# Patient Record
Sex: Male | Born: 1960 | Race: White | Hispanic: No
Health system: Midwestern US, Community
[De-identification: ages and names within clinical notes are randomized; demographics above are authoritative.]

## PROBLEM LIST (undated history)

## (undated) DIAGNOSIS — I739 Peripheral vascular disease, unspecified: Secondary | ICD-10-CM

## (undated) DIAGNOSIS — G4730 Sleep apnea, unspecified: Secondary | ICD-10-CM

## (undated) DIAGNOSIS — E785 Hyperlipidemia, unspecified: Secondary | ICD-10-CM

## (undated) DIAGNOSIS — I10 Essential (primary) hypertension: Secondary | ICD-10-CM

## (undated) DIAGNOSIS — R634 Abnormal weight loss: Secondary | ICD-10-CM

## (undated) DIAGNOSIS — N189 Chronic kidney disease, unspecified: Secondary | ICD-10-CM

## (undated) DIAGNOSIS — Z87442 Personal history of urinary calculi: Secondary | ICD-10-CM

## (undated) DIAGNOSIS — H47011 Ischemic optic neuropathy, right eye: Secondary | ICD-10-CM

## (undated) DIAGNOSIS — R0602 Shortness of breath: Secondary | ICD-10-CM

## (undated) DIAGNOSIS — J45909 Unspecified asthma, uncomplicated: Secondary | ICD-10-CM

## (undated) DIAGNOSIS — R42 Dizziness and giddiness: Secondary | ICD-10-CM

## (undated) DIAGNOSIS — N400 Enlarged prostate without lower urinary tract symptoms: Secondary | ICD-10-CM

## (undated) DIAGNOSIS — H9319 Tinnitus, unspecified ear: Secondary | ICD-10-CM

## (undated) DIAGNOSIS — J189 Pneumonia, unspecified organism: Secondary | ICD-10-CM

## (undated) DIAGNOSIS — D400 Neoplasm of uncertain behavior of prostate: Secondary | ICD-10-CM

## (undated) DIAGNOSIS — N179 Acute kidney failure, unspecified: Secondary | ICD-10-CM

## (undated) DIAGNOSIS — N200 Calculus of kidney: Secondary | ICD-10-CM

## (undated) DIAGNOSIS — N401 Enlarged prostate with lower urinary tract symptoms: Secondary | ICD-10-CM

## (undated) DIAGNOSIS — N50812 Left testicular pain: Secondary | ICD-10-CM

## (undated) DIAGNOSIS — R351 Nocturia: Secondary | ICD-10-CM

## (undated) HISTORY — DX: Hyperlipidemia, unspecified: E78.5

## (undated) HISTORY — PX: CARDIAC CATHETERIZATION: SHX172

## (undated) HISTORY — DX: Shortness of breath: R06.02

## (undated) HISTORY — DX: Peripheral vascular disease, unspecified: I73.9

## (undated) HISTORY — DX: Essential (primary) hypertension: I10

## (undated) HISTORY — PX: DIAPHRAGM PLICATION: SHX1457

## (undated) HISTORY — DX: Ischemic optic neuropathy, right eye: H47.011

## (undated) HISTORY — DX: Abnormal weight loss: R63.4

## (undated) HISTORY — PX: SKIN GRAFT: SHX250

## (undated) HISTORY — DX: Chronic kidney disease, unspecified: N18.9

## (undated) HISTORY — DX: Tinnitus, unspecified ear: H93.19

## (undated) HISTORY — DX: Dizziness and giddiness: R42

## (undated) HISTORY — PX: SMALL INTESTINE SURGERY: SHX150

## (undated) HISTORY — PX: LITHOTRIPSY: SUR834

## (undated) HISTORY — DX: Unspecified asthma, uncomplicated: J45.909

## (undated) HISTORY — PX: SEPTOPLASTY: SUR1290

## (undated) HISTORY — DX: Sleep apnea, unspecified: G47.30

---

## 1978-09-29 HISTORY — PX: APPENDECTOMY: SHX54

## 2012-09-29 HISTORY — PX: ROTATOR CUFF REPAIR: SHX139

## 2015-08-31 DIAGNOSIS — R079 Chest pain, unspecified: Secondary | ICD-10-CM | POA: Insufficient documentation

## 2015-08-31 DIAGNOSIS — R0602 Shortness of breath: Secondary | ICD-10-CM | POA: Insufficient documentation

## 2015-08-31 DIAGNOSIS — I10 Essential (primary) hypertension: Secondary | ICD-10-CM | POA: Insufficient documentation

## 2015-08-31 DIAGNOSIS — K589 Irritable bowel syndrome without diarrhea: Secondary | ICD-10-CM | POA: Insufficient documentation

## 2015-08-31 DIAGNOSIS — J3489 Other specified disorders of nose and nasal sinuses: Secondary | ICD-10-CM | POA: Insufficient documentation

## 2016-04-21 DIAGNOSIS — D400 Neoplasm of uncertain behavior of prostate: Secondary | ICD-10-CM | POA: Insufficient documentation

## 2016-06-12 ENCOUNTER — Telehealth

## 2016-06-12 NOTE — Telephone Encounter (Signed)
Patient was called and told to have uric acid lab work done at [facility]. Patient stated that he will have it done 06/16/16. Lab order was faxed to [phone].

## 2016-06-12 NOTE — Progress Notes (Signed)
Order serum uric acid level

## 2016-06-30 ENCOUNTER — Ambulatory Visit: Admit: 2016-06-30 | Discharge: 2016-06-30 | Payer: PRIVATE HEALTH INSURANCE | Attending: Urology

## 2016-06-30 ENCOUNTER — Encounter: Attending: Urology

## 2016-06-30 DIAGNOSIS — Z87442 Personal history of urinary calculi: Secondary | ICD-10-CM

## 2016-06-30 LAB — AMB POC URINALYSIS DIP STICK AUTO W/ MICRO
Bilirubin (UA POC): NEGATIVE
Blood (UA POC): NEGATIVE
Glucose (UA POC): NEGATIVE
Ketones (UA POC): NEGATIVE
Leukocyte esterase (UA POC): NEGATIVE
Nitrites (UA POC): NEGATIVE
Protein (UA POC): NEGATIVE mg/dL
Specific gravity (UA POC): 1.025 (ref 1.001–1.035)
Urobilinogen (UA POC): 0.2 (ref 0.2–1)
pH (UA POC): 5.5 (ref 4.6–8.0)

## 2016-06-30 LAB — AMB POC URINALYSIS DIP STICK AUTO W/ MICRO (MICRO RESULTS)
Bilirubin (UA POC): NEGATIVE
Blood (UA POC): NEGATIVE
Crystals (UA POC): NEGATIVE
Epithelial cells (UA POC): 0
Glucose (UA POC): NEGATIVE
Ketones (UA POC): NEGATIVE
Leukocyte esterase (UA POC): NEGATIVE
Nitrites (UA POC): NEGATIVE
Protein (UA POC): NEGATIVE mg/dL
RBCs (UA POC): 0
Specific gravity (UA POC): 1.025 (ref 1.001–1.035)
Urobilinogen (UA POC): 0.2 (ref 0.2–1)
WBCs (UA POC): 0
pH (UA POC): 5.5 (ref 4.6–8.0)

## 2016-06-30 MED ORDER — POTASSIUM CITRATE SR 10 MEQ (1,080 MG) TAB
10 mEq (1,080 mg) | ORAL_TABLET | Freq: Three times a day (TID) | ORAL | 12 refills | Status: DC
Start: 2016-06-30 — End: 2016-09-09

## 2016-06-30 NOTE — Progress Notes (Signed)
HISTORY OF PRESENT ILLNESS:  Stanley Young is a 55 y.o. male who presents today f/u 24 hr urine. Pt w/ elevated PTH level, seeing Dr.Gomez for hyperparathyroidism. Serum uric acid normal. 24 hr urine shows needs to double fluid intake and low urine pH. Hx of BPH and post-op retention, on Flomax. Found to have firm prostate on exam.     Pt reports doing well. Not satisfied w/ his care from Dr. Altamease Young, so will refer elsewhere.      AUA Symptom Score 06/30/2016   Over the past month how often have you had the sensation that your bladder was not completely empty after you finished urinating? 0   Over the past month, how often have had to urinate again less than 2 hours after you last finished urinating? 0   Over the past month, how often have you found you stopped and started again several times when you urinated? 0   Over the past month, how often have you found it difficult to postpone urination? 0   Over the past month, how often have you had a weak urinary stream? 0   Over the past month, how often have you had to push or strain to begin urinating? 0   Over the past month, how many times did you most typically get up to urinate from the time you went to bed at night until the time you got up in the morning? 0   AUA Score 0   If you were to spend the rest of your life with your urinary condition the way it is now, how would you feel about that? Pleased       Past Medical History:   Diagnosis Date   ??? Asthma    ??? BPH (benign prostatic hypertrophy)    ??? Hypertension    ??? Kidney stone    ??? Prostate neoplasm        Past Surgical History:   Procedure Laterality Date   ??? FRAGMENT KIDNEY STONE/ ESWL     ??? LASER VAPORIZATION SURGERY PROSTATE, COMPLETE         Social History   Substance Use Topics   ??? Smoking status: Never Smoker   ??? Smokeless tobacco: Never Used   ??? Alcohol use No       Allergies   Allergen Reactions   ??? Codeine Unable to Obtain   ??? Hydrocodone Not Reported This Time       Family History    Problem Relation Age of Onset   ??? Alzheimer Mother    ??? Cancer Father    ??? Heart Attack Father        Current Outpatient Prescriptions   Medication Sig Dispense Refill   ??? potassium citrate (UROCIT-K10) 10 mEq (1,080 mg) TbER Take 1 Tab by mouth three (3) times daily (with meals). 90 Tab 12   ??? tamsulosin (FLOMAX) 0.4 mg capsule Take 0.4 mg by mouth daily.     ??? lisinopril (PRINIVIL, ZESTRIL) 20 mg tablet Take  by mouth daily.         REVIEW OF SYSTEMS:  Constitutional: Fever: No  Skin: Rash: No  HEENT: Hearing difficulty: No  Eyes: Blurred vision: No  Cardiovascular: Chest pain: No  Respiratory: Shortness of breath: No  Gastrointestinal: Nausea/vomiting: No  Musculoskeletal: Back pain: No  Neurological: Weakness: No  Psychological: Memory loss: No  Comments/additional findings:       PHYSICAL EXAMINATION:   Visit Vitals   ??? BP 117/87   ???  Pulse (!) 114   ??? Temp 98 ??F (36.7 ??C)   ??? Resp 18   ??? Ht 6' (1.829 m)   ??? Wt 237 lb (107.5 kg)   ??? BMI 32.14 kg/m2     Constitutional: Well developed, no acute distress.   Eyes:  Conjunctiva normal.  Ears:  External ear normal.  Nose/Throat:  External nose normal.  CV:  Heart rate regular, No peripheral swelling noted.  Respiratory: No respiratory distress, No audible wheeze.  Skin: No rash, No ulcer.    Neuro/Psych:  Patient with appropriate affect.  Alert and oriented x 3.    Gait:  Normal.  GU Male:    [id number] normal to visual inspection. Sphincter with good tone, Rectum with no masses. Prostate is medium.  PENIS: Urethral meatus normal in location and size. No urethral discharge.      REVIEW OF LABS AND IMAGING:      Results for orders placed or performed in visit on 06/30/16   AMB POC URINALYSIS DIP STICK AUTO W/ MICRO   Result Value Ref Range    Color (UA POC) Yellow     Clarity (UA POC) Clear     Glucose (UA POC) Negative Negative    Bilirubin (UA POC) Negative Negative    Ketones (UA POC) Negative Negative    Specific gravity (UA POC) 1.025 1.001 - 1.035     Blood (UA POC) Negative Negative    pH (UA POC) 5.5 4.6 - 8.0    Protein (UA POC) Negative Negative mg/dL    Urobilinogen (UA POC) 0.2 mg/dL 0.2 - 1    Nitrites (UA POC) Negative Negative    Leukocyte esterase (UA POC) Negative Negative   AMB POC URINALYSIS DIP STICK AUTO W/ MICRO (MICRO RESULTS)   Result Value Ref Range    Color (UA POC) Yellow     Clarity (UA POC) Clear     Glucose (UA POC) Negative Negative    Bilirubin (UA POC) Negative Negative    Ketones (UA POC) Negative Negative    Specific gravity (UA POC) 1.025 1.001 - 1.035    Blood (UA POC) Negative Negative    pH (UA POC) 5.5 4.6 - 8.0    Protein (UA POC) Negative Negative mg/dL    Urobilinogen (UA POC) 0.2 mg/dL 0.2 - 1    Nitrites (UA POC) Negative Negative    Leukocyte esterase (UA POC) Negative Negative    Epithelial cells (UA POC) 0     WBCs (UA POC) 0      RBCs (UA POC) 0      Bacteria (UA POC) None Negative    Crystals (UA POC) Negative Negative    Other (UA POC)         ASSESSMENT:     ICD-10-CM ICD-9-CM    1. Personal history of urinary calculi Z87.442 V13.01 AMB POC URINALYSIS DIP STICK AUTO W/ MICRO      AMB POC URINALYSIS DIP STICK AUTO W/ MICRO (MICRO RESULTS)   2. Benign prostatic hyperplasia with lower urinary tract symptoms, symptom details unspecified N40.1 600.01 AMB POC URINALYSIS DIP STICK AUTO W/ MICRO      AMB POC URINALYSIS DIP STICK AUTO W/ MICRO (MICRO RESULTS)      PSA, DIAGNOSTIC (PROSTATE SPECIFIC AG)         PLAN:    ?? PSA today  ?? Refer to another ENT per patient request- has rising PTH level and prescribed Vitamin D by Dr. Altamease Young, for eval  for possible hyperparathyroidism and need for parathyroidectomy  ?? Rx potassium citrate TID  ?? Repeat 24 hr urine in 2 months w/ f/u after- NV pH and chem 7 at 4 and 6 wks  ?? DRE and PSA in 1 yr     Patient's BMI is out of the normal parameters.  Information about BMI was given to the patient.    Chief Complaint   Patient presents with   ??? Kidney Stone     2 mo f/u 24 urine      Medical documentation provided with the assistance of Lonell Grandchild, medical scribe for [employer], DO, [place].    Jaydi Bray [place], DO

## 2016-07-01 LAB — PSA, DIAGNOSTIC (PROSTATE SPECIFIC AG): Prostate Specific Ag: 0.5 ng/ml

## 2016-07-28 ENCOUNTER — Encounter

## 2016-07-30 ENCOUNTER — Institutional Professional Consult (permissible substitution): Admit: 2016-07-30 | Discharge: 2016-07-30 | Payer: PRIVATE HEALTH INSURANCE

## 2016-07-30 DIAGNOSIS — Z87442 Personal history of urinary calculi: Secondary | ICD-10-CM

## 2016-07-30 LAB — AMB POC URINALYSIS DIP STICK AUTO W/O MICRO
Blood (UA POC): NEGATIVE
Glucose (UA POC): NEGATIVE
Leukocyte esterase (UA POC): NEGATIVE
Nitrites (UA POC): NEGATIVE
Protein (UA POC): NEGATIVE mg/dL
Specific gravity (UA POC): 1.025 (ref 1.001–1.035)
Urobilinogen (UA POC): 0.2 (ref 0.2–1)
pH (UA POC): 5.5 (ref 4.6–8.0)

## 2016-07-30 NOTE — Progress Notes (Signed)
Stanley Young is here today per the request of Dr. Reinaldo Raddle.       He is here to give a urine specimen.  Urine is obtained from patient via clean catch.     Patient complains of nothing   Patient denies dysuria     Urine was not sent for culture.   This is not a repeat urine culture.   UA performed: yes    Results for orders placed or performed in visit on 07/30/16   AMB POC URINALYSIS DIP STICK AUTO W/O MICRO   Result Value Ref Range    Color (UA POC) Amber     Clarity (UA POC) Clear     Glucose (UA POC) Negative Negative    Bilirubin (UA POC) 1+ Negative    Ketones (UA POC) Trace Negative    Specific gravity (UA POC) 1.025 1.001 - 1.035    Blood (UA POC) Negative Negative    pH (UA POC) 5.5 4.6 - 8.0    Protein (UA POC) Negative Negative mg/dL    Urobilinogen (UA POC) 0.2 mg/dL 0.2 - 1    Nitrites (UA POC) Negative Negative    Leukocyte esterase (UA POC) Negative Negative        Urine was not sent to [facility] for processing of urine culture.   Patient is informed that it will be at least 48 hours before results are available     Orders Placed This Encounter   ??? AMB POC URINALYSIS DIP STICK AUTO W/O MICRO     Reviewed by [facility], DO.

## 2016-07-31 NOTE — Progress Notes (Signed)
Order renal ultrasound- acute renal failure- tell patient Cr a little high so want ultrasound.

## 2016-08-01 ENCOUNTER — Encounter

## 2016-08-01 ENCOUNTER — Telehealth

## 2016-08-01 NOTE — Telephone Encounter (Signed)
-----   Message from [facility], [place] sent at 07/31/2016  1:00 PM EDT -----  Order renal ultrasound- acute renal failure- tell patient Cr a little high so want ultrasound.

## 2016-08-01 NOTE — Telephone Encounter (Signed)
Patient notified of lab results and renal ultrasound 08/08/16 at 2:00PM. Ultrasound order has been faxed to radiology department at [place]

## 2016-08-13 ENCOUNTER — Encounter

## 2016-08-14 ENCOUNTER — Ambulatory Visit: Admit: 2016-08-14 | Discharge: 2016-08-14 | Payer: PRIVATE HEALTH INSURANCE | Attending: Urology

## 2016-08-14 DIAGNOSIS — R1032 Left lower quadrant pain: Secondary | ICD-10-CM

## 2016-08-14 LAB — AMB POC URINALYSIS DIP STICK AUTO W/ MICRO (MICRO RESULTS)
Bilirubin (UA POC): NEGATIVE
Blood (UA POC): NEGATIVE
Crystals (UA POC): NEGATIVE
Epithelial cells (UA POC): 0
Glucose (UA POC): NEGATIVE
Ketones (UA POC): NEGATIVE
Leukocyte esterase (UA POC): NEGATIVE
Nitrites (UA POC): NEGATIVE
Protein (UA POC): NEGATIVE
RBCs (UA POC): 0
Specific gravity (UA POC): 1.025 (ref 1.001–1.035)
Urobilinogen (UA POC): 0.2 (ref 0.2–1)
WBCs (UA POC): 0
pH (UA POC): 5.5 (ref 4.6–8.0)

## 2016-08-14 LAB — AMB POC PVR, MEAS,POST-VOID RES,US,NON-IMAGING: PVR: 0 cc

## 2016-08-14 NOTE — Progress Notes (Signed)
HISTORY OF PRESENT ILLNESS:  Stanley Young is a 55 y.o. male who presents today for f/u recent flank and groin pain. Pt w/ hx of stones, BPH, firm prostate, and elevated PTH. Pt on Flomax and K citrate. K and Cr wnl. PH today normal. Renal ultrasound done due to mild renal failure neg. Scrotal ultrasound done for left groin pain showed a mild right varicocele.     Pt notes that he has severe, sharp left groin pain that has been present for approx 2 months. Has gradually gotten worse and is becoming more constant.     AUA Symptom Score 08/14/2016   Over the past month how often have you had the sensation that your bladder was not completely empty after you finished urinating? 3   Over the past month, how often have had to urinate again less than 2 hours after you last finished urinating? 4   Over the past month, how often have you found you stopped and started again several times when you urinated? 1   Over the past month, how often have you found it difficult to postpone urination? 2   Over the past month, how often have you had a weak urinary stream? 1   Over the past month, how often have you had to push or strain to begin urinating? 2   Over the past month, how many times did you most typically get up to urinate from the time you went to bed at night until the time you got up in the morning? 4   AUA Score 17   If you were to spend the rest of your life with your urinary condition the way it is now, how would you feel about that? Mixed-about equally satisfied       Past Medical History:   Diagnosis Date   ??? Asthma    ??? BPH (benign prostatic hypertrophy)    ??? Hypertension    ??? Kidney stone    ??? Prostate neoplasm        Past Surgical History:   Procedure Laterality Date   ??? FRAGMENT KIDNEY STONE/ ESWL     ??? LASER VAPORIZATION SURGERY PROSTATE, COMPLETE         Social History   Substance Use Topics   ??? Smoking status: Never Smoker   ??? Smokeless tobacco: Never Used   ??? Alcohol use No       Allergies    Allergen Reactions   ??? Codeine Unable to Obtain   ??? Hydrocodone Not Reported This Time       Family History   Problem Relation Age of Onset   ??? Alzheimer Mother    ??? Cancer Father    ??? Heart Attack Father        Current Outpatient Prescriptions   Medication Sig Dispense Refill   ??? potassium citrate (UROCIT-K10) 10 mEq (1,080 mg) TbER Take 1 Tab by mouth three (3) times daily (with meals). 90 Tab 12   ??? tamsulosin (FLOMAX) 0.4 mg capsule Take 0.4 mg by mouth daily.     ??? lisinopril (PRINIVIL, ZESTRIL) 20 mg tablet Take  by mouth daily.         REVIEW OF SYSTEMS:  Constitutional: Fever: No  Skin: Rash: No  HEENT: Hearing difficulty: No  Eyes: Blurred vision: No  Cardiovascular: Chest pain: No  Respiratory: Shortness of breath: No  Gastrointestinal: Nausea/vomiting: No  Musculoskeletal: Back pain: No  Neurological: Weakness: No  Psychological: Memory loss: No  Comments/additional  findings:       PHYSICAL EXAMINATION:   Visit Vitals   ??? BP (!) 133/96   ??? Pulse 98   ??? Temp 98.8 ??F (37.1 ??C)   ??? Resp 18   ??? Ht 6' (1.829 m)   ??? Wt 236 lb (107 kg)   ??? BMI 32.01 kg/m2     Constitutional: Well developed, no acute distress.   Eyes:  Conjunctiva normal.  Ears:  External ear normal.  Nose/Throat:  External nose normal.  CV:  Heart rate regular, No peripheral swelling noted.  Respiratory: No respiratory distress, No audible wheeze.  Skin: No rash, No ulcer.    Neuro/Psych:  Patient with appropriate affect.  Alert and oriented x 3.    Gait:  Normal.      REVIEW OF LABS AND IMAGING:      Results for orders placed or performed in visit on 08/14/16   AMB POC URINALYSIS DIP STICK AUTO W/ MICRO (MICRO RESULTS)   Result Value Ref Range    Color (UA POC) Yellow     Clarity (UA POC) Clear     Glucose (UA POC) Negative Negative    Bilirubin (UA POC) Negative Negative    Ketones (UA POC) Negative Negative    Specific gravity (UA POC) 1.025 1.001 - 1.035    Blood (UA POC) Negative Negative    pH (UA POC) 5.5 4.6 - 8.0     Protein (UA POC) Negative Negative    Urobilinogen (UA POC) 0.2 mg/dL 0.2 - 1    Nitrites (UA POC) Negative Negative    Leukocyte esterase (UA POC) Negative Negative    Epithelial cells (UA POC) 0     WBCs (UA POC) 0      RBCs (UA POC) 0      Bacteria (UA POC) None Negative    Crystals (UA POC) Negative Negative    Other (UA POC)     AMB POC PVR, MEAS,POST-VOID RES,US,NON-IMAGING   Result Value Ref Range    PVR 0 cc       ASSESSMENT:     ICD-10-CM ICD-9-CM    1. Left inguinal pain R10.32 789.09 CT ABD PELV W WO CONT   2. Benign prostatic hyperplasia with lower urinary tract symptoms, symptom details unspecified N40.1 600.01 AMB POC URINALYSIS DIP STICK AUTO W/ MICRO (MICRO RESULTS)      AMB POC PVR, MEAS,POST-VOID RES,US,NON-IMAGING   3. Personal history of urinary calculi Z87.442 V13.01    4. Acute renal failure, unspecified acute renal failure type ([place]) N17.9 584.9    5. Right varicocele I86.1 456.4    6. Pelvic pain R10.2 [id number]          PLAN:    ?? Schedule for CT pelvis w contrast for left pelvic/inguinal pain  ?? Repeat 24 hr urine in December  ?? F/u as scheduled  ?? Cont K citrate and Flomax  ?? Chem 7 and pH in 6 months  ?? If Cr still elevated w/ refer to nephrology  ?? Full exam and PSA due in October 2018  ?? Cont f/u w/ ENT and endocrinology     Patient's BMI is out of the normal parameters.  Information about BMI was given to the patient.    Chief Complaint   Patient presents with   ??? Pelvic Pain     Medical documentation provided with the assistance of Lonell Grandchild, medical scribe for [employer], [place], [place].    Zimri Brennen [place], DO

## 2016-08-18 DIAGNOSIS — N400 Enlarged prostate without lower urinary tract symptoms: Secondary | ICD-10-CM | POA: Insufficient documentation

## 2016-08-18 DIAGNOSIS — N219 Calculus of lower urinary tract, unspecified: Secondary | ICD-10-CM | POA: Insufficient documentation

## 2016-08-20 NOTE — Telephone Encounter (Signed)
CT scan is Dec. 01, 2017 at 8:30AM. Patient was called and verbalized understanding of CT appointment.

## 2016-08-25 DIAGNOSIS — N183 Chronic kidney disease, stage 3 unspecified: Secondary | ICD-10-CM | POA: Insufficient documentation

## 2016-09-01 NOTE — Progress Notes (Signed)
Review ct

## 2016-09-08 NOTE — Telephone Encounter (Signed)
Patient was notified that Dr. Reinaldo Raddle does need to see patient tomorrow. Patient voiced understanding.

## 2016-09-08 NOTE — Telephone Encounter (Signed)
Patient would like to know if he has to keep appointment tomorrow. He is now seeing a nephrologist and they went over his 24hr results with him. He also states that he spoke with [place] last week and Darrick Meigs was supposed to notify him if his recent imaging was negative or not. Patient states he can't afford to pay $80 co-pay and miss time from work if everything is negative.

## 2016-09-09 ENCOUNTER — Ambulatory Visit: Admit: 2016-09-09 | Discharge: 2016-09-09 | Payer: PRIVATE HEALTH INSURANCE | Attending: Urology

## 2016-09-09 DIAGNOSIS — K4090 Unilateral inguinal hernia, without obstruction or gangrene, not specified as recurrent: Secondary | ICD-10-CM

## 2016-09-09 LAB — AMB POC URINALYSIS DIP STICK AUTO W/ MICRO (MICRO RESULTS)
Bilirubin (UA POC): NEGATIVE
Blood (UA POC): NEGATIVE
Crystals (UA POC): NEGATIVE
Epithelial cells (UA POC): 0
Glucose (UA POC): NEGATIVE
Ketones (UA POC): NEGATIVE
Leukocyte esterase (UA POC): NEGATIVE
Nitrites (UA POC): NEGATIVE
Protein (UA POC): NEGATIVE
RBCs (UA POC): 0
Specific gravity (UA POC): 1.02 (ref 1.001–1.035)
Urobilinogen (UA POC): 1 (ref 0.2–1)
WBCs (UA POC): 0
pH (UA POC): 6 (ref 4.6–8.0)

## 2016-09-09 MED ORDER — POTASSIUM CITRATE ER 15 MEQ (1,620 MG) TABLET,EXTENDED RELEASE
15 mEq | ORAL_TABLET | Freq: Three times a day (TID) | ORAL | 12 refills | Status: DC
Start: 2016-09-09 — End: 2017-10-26

## 2016-09-09 NOTE — Progress Notes (Signed)
HISTORY OF PRESENT ILLNESS:  Stanley Young is a 55 y.o. male who presents today for f/u hx of BPH - on Flomax, firm prostate, hx of stones - on K citrate. Has elevated PTH level, seeing ENT. C/o left severe pelvic/inguinal pain - found to have very small right varicocele. CT shows right proximal ureteral thickening and a left fat containing inguinal hernia. 24 hr urine shows patient still needs to increase fluids, Caox level high, and low urine pH.    AUA Symptom Score 09/09/2016   Over the past month how often have you had the sensation that your bladder was not completely empty after you finished urinating? 1   Over the past month, how often have had to urinate again less than 2 hours after you last finished urinating? 3   Over the past month, how often have you found you stopped and started again several times when you urinated? 2   Over the past month, how often have you found it difficult to postpone urination? 2   Over the past month, how often have you had a weak urinary stream? 1   Over the past month, how often have you had to push or strain to begin urinating? 1   Over the past month, how many times did you most typically get up to urinate from the time you went to bed at night until the time you got up in the morning? 3   AUA Score 13   If you were to spend the rest of your life with your urinary condition the way it is now, how would you feel about that? Mixed-about equally satisfied       Past Medical History:   Diagnosis Date   ??? Asthma    ??? BPH (benign prostatic hypertrophy)    ??? Hypertension    ??? Kidney stone    ??? Prostate neoplasm        Past Surgical History:   Procedure Laterality Date   ??? FRAGMENT KIDNEY STONE/ ESWL     ??? LASER VAPORIZATION SURGERY PROSTATE, COMPLETE         Social History   Substance Use Topics   ??? Smoking status: Never Smoker   ??? Smokeless tobacco: Never Used   ??? Alcohol use No       Allergies   Allergen Reactions   ??? Codeine Unable to Obtain    ??? Hydrocodone Not Reported This Time       Family History   Problem Relation Age of Onset   ??? Alzheimer Mother    ??? Cancer Father    ??? Heart Attack Father        Current Outpatient Prescriptions   Medication Sig Dispense Refill   ??? Potassium Citrate (UROCIT-K) TbER tablet Take 1 Tab by mouth three (3) times daily (with meals). 90 Tab 12   ??? tamsulosin (FLOMAX) 0.4 mg capsule Take 0.4 mg by mouth daily.     ??? lisinopril (PRINIVIL, ZESTRIL) 20 mg tablet Take  by mouth daily.         REVIEW OF SYSTEMS:  Constitutional: Fever: No  Skin: Rash: No  HEENT: Hearing difficulty: No  Eyes: Blurred vision: No  Cardiovascular: Chest pain: No  Respiratory: Shortness of breath: No  Gastrointestinal: Nausea/vomiting: No  Musculoskeletal: Back pain: No  Neurological: Weakness: No  Psychological: Memory loss: No  Comments/additional findings:       PHYSICAL EXAMINATION:   Visit Vitals   ??? BP (!) 149/91   ???  Pulse 86   ??? Temp 98.9 ??F (37.2 ??C)   ??? Ht 6' (1.829 m)   ??? Wt 236 lb (107 kg)   ??? BMI 32.01 kg/m2     Constitutional: Well developed, no acute distress.   Eyes:  Conjunctiva normal.  Ears:  External ear normal.  Nose/Throat:  External nose normal.  CV:  Heart rate regular, No peripheral swelling noted.  Respiratory: No respiratory distress, No audible wheeze.  Skin: No rash, No ulcer.    Neuro/Psych:  Patient with appropriate affect.  Alert and oriented x 3.    Gait:  Normal.      REVIEW OF LABS AND IMAGING:      Results for orders placed or performed in visit on 09/09/16   AMB POC URINALYSIS DIP STICK AUTO W/ MICRO (MICRO RESULTS)   Result Value Ref Range    Color (UA POC) Yellow     Clarity (UA POC) Clear     Glucose (UA POC) Negative Negative    Bilirubin (UA POC) Negative Negative    Ketones (UA POC) Negative Negative    Specific gravity (UA POC) 1.020 1.001 - 1.035    Blood (UA POC) Negative Negative    pH (UA POC) 6.0 4.6 - 8.0    Protein (UA POC) Negative Negative    Urobilinogen (UA POC) 1 mg/dL 0.2 - 1     Nitrites (UA POC) Negative Negative    Leukocyte esterase (UA POC) Negative Negative    Epithelial cells (UA POC) 0     WBCs (UA POC) 0      RBCs (UA POC) 0      Bacteria (UA POC) None Negative    Crystals (UA POC) Negative Negative    Other (UA POC)         ASSESSMENT:     ICD-10-CM ICD-9-CM    1. Left inguinal hernia K40.90 550.90    2. Benign prostatic hyperplasia with lower urinary tract symptoms, symptom details unspecified N40.1 600.01    3. Personal history of urinary calculi Z87.442 V13.01    4. 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 Increase K citrate to 65meq TID, told to increase fluids, follow with endocrine/ENT  ?? Refer to general surgery for left inguinal hernia  ?? Plan right ureteroscopy w/ possible biopsy- hopefully at time of inguinal hernia repair   ?? Plan full exam and PSA in October 2018     Patient's BMI is out of the normal parameters.  Information about BMI was given and patient was advised to follow-up with their PCP for further management.    Chief Complaint   Patient presents with   ??? Kidney Stone     f/u     Medical documentation provided with the assistance of Lonell Grandchild, medical scribe for [employer], DO, [place].    Cole Klugh [place], DO

## 2016-09-10 ENCOUNTER — Encounter: Attending: Urology

## 2016-09-29 HISTORY — PX: HERNIA REPAIR: SHX51

## 2016-10-03 ENCOUNTER — Ambulatory Visit: Admit: 2016-10-03 | Discharge: 2016-10-03 | Payer: PRIVATE HEALTH INSURANCE | Attending: Urology

## 2016-10-03 DIAGNOSIS — N401 Enlarged prostate with lower urinary tract symptoms: Secondary | ICD-10-CM

## 2016-10-03 LAB — AMB POC URINALYSIS DIP STICK AUTO W/ MICRO (MICRO RESULTS)
Blood (UA POC): NEGATIVE
Crystals (UA POC): NEGATIVE
Epithelial cells (UA POC): 0
Glucose (UA POC): NEGATIVE
Ketones (UA POC): NEGATIVE
Leukocyte esterase (UA POC): NEGATIVE
Nitrites (UA POC): NEGATIVE
Protein (UA POC): NEGATIVE
RBCs (UA POC): 0
Specific gravity (UA POC): 1.03 (ref 1.001–1.035)
Urobilinogen (UA POC): 0.2 (ref 0.2–1)
WBCs (UA POC): 0
pH (UA POC): 5.5 (ref 4.6–8.0)

## 2016-10-03 NOTE — Progress Notes (Signed)
HISTORY OF PRESENT ILLNESS:  Stanley Young is a 56 y.o. male who presents today for f/u BPH, on Flomax. Hx of firm prostate, stones - on K citrate, and high PTH - sees ENT. Was found to have right proximal ureteral thickening and a left inguinal hernia. 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     Pt reports doing well. Still having soreness from inguinal hernia repair. Pt denies any stone pain. Denies any dysuria or hematuria.     AUA Symptom Score 10/03/2016   Over the past month how often have you had the sensation that your bladder was not completely empty after you finished urinating? 3   Over the past month, how often have had to urinate again less than 2 hours after you last finished urinating? 3   Over the past month, how often have you found you stopped and started again several times when you urinated? 2   Over the past month, how often have you found it difficult to postpone urination? 3   Over the past month, how often have you had a weak urinary stream? 2   Over the past month, how often have you had to push or strain to begin urinating? 1   Over the past month, how many times did you most typically get up to urinate from the time you went to bed at night until the time you got up in the morning? 3   AUA Score 17   If you were to spend the rest of your life with your urinary condition the way it is now, how would you feel about that? Mixed-about equally satisfied       Past Medical History:   Diagnosis Date   ??? Asthma    ??? BPH (benign prostatic hypertrophy)    ??? Hypertension    ??? Kidney stone    ??? Prostate neoplasm        Past Surgical History:   Procedure Laterality Date   ??? FRAGMENT KIDNEY STONE/ ESWL     ??? LASER VAPORIZATION SURGERY PROSTATE, COMPLETE         Social History   Substance Use Topics   ??? Smoking status: Never Smoker   ??? Smokeless tobacco: Never Used   ??? Alcohol use No       Allergies   Allergen Reactions   ??? Codeine Unable to Obtain    ??? Hydrocodone Not Reported This Time       Family History   Problem Relation Age of Onset   ??? Alzheimer Mother    ??? Cancer Father    ??? Heart Attack Father        Current Outpatient Prescriptions   Medication Sig Dispense Refill   ??? Potassium Citrate (UROCIT-K) TbER tablet Take 1 Tab by mouth three (3) times daily (with meals). 90 Tab 12   ??? tamsulosin (FLOMAX) 0.4 mg capsule Take 0.4 mg by mouth daily.     ??? lisinopril (PRINIVIL, ZESTRIL) 20 mg tablet Take  by mouth daily.         REVIEW OF SYSTEMS:  Constitutional: Fever: No  Skin: Rash: No  HEENT: Hearing difficulty: No  Eyes: Blurred vision: No  Cardiovascular: Chest pain: No  Respiratory: Shortness of breath: No  Gastrointestinal: Nausea/vomiting: No  Musculoskeletal: Back pain: No  Neurological: Weakness: No  Psychological: Memory loss: No  Comments/additional findings:       PHYSICAL EXAMINATION:   Visit Vitals   ???  BP 118/87   ??? Pulse 92   ??? Temp 97.6 ??F (36.4 ??C)   ??? Resp 16   ??? Ht 6' (1.829 m)   ??? Wt 234 lb (106.1 kg)   ??? BMI 31.74 kg/m2     Constitutional: Well developed, no acute distress.   Eyes:  Conjunctiva normal.  Ears:  External ear normal.  Nose/Throat:  External nose normal.  CV:  Heart rate regular, No peripheral swelling noted.  Respiratory: No respiratory distress, No audible wheeze.  Skin: No rash, No ulcer.    Neuro/Psych:  Patient with appropriate affect.  Alert and oriented x 3.    Gait:  Normal.      REVIEW OF LABS AND IMAGING:      Results for orders placed or performed in visit on 10/03/16   AMB POC URINALYSIS DIP STICK AUTO W/ MICRO (MICRO RESULTS)   Result Value Ref Range    Color (UA POC) Yellow     Clarity (UA POC) Clear     Glucose (UA POC) Negative Negative    Bilirubin (UA POC) 1+ Negative    Ketones (UA POC) Negative Negative    Specific gravity (UA POC) 1.030 1.001 - 1.035    Blood (UA POC) Negative Negative    pH (UA POC) 5.5 4.6 - 8.0    Protein (UA POC) Negative Negative    Urobilinogen (UA POC) 0.2 mg/dL 0.2 - 1     Nitrites (UA POC) Negative Negative    Leukocyte esterase (UA POC) Negative Negative    Epithelial cells (UA POC) 0     WBCs (UA POC) 0      RBCs (UA POC) 0      Bacteria (UA POC) None Negative    Crystals (UA POC) Negative Negative    Other (UA POC)         ASSESSMENT:     ICD-10-CM ICD-9-CM    1. Benign prostatic hyperplasia with lower urinary tract symptoms, symptom details unspecified N40.1 600.01    2. 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 Personal history of urinary calculi Z87.442 V13.01    4. Left inguinal hernia K40.90 550.90         PLAN:    ?? Cont K citrate and Flomax  ?? F/u end of October w/ PSA - reminder set  ?? KUB 1 yr - reminder set     Patient's BMI is out of the normal parameters.  Information about BMI was given and patient was advised to follow-up with their PCP for further management.    Chief Complaint   Patient presents with   ??? Post OP Follow Up     f/u ureteroscopy     Medical documentation provided with the assistance of Lonell Grandchild, medical scribe for [employer], DO, [place].    Tulsi Crossett [place], DO

## 2016-10-27 DIAGNOSIS — Z8639 Personal history of other endocrine, nutritional and metabolic disease: Secondary | ICD-10-CM | POA: Insufficient documentation

## 2017-08-03 ENCOUNTER — Encounter: Attending: Urology

## 2017-10-01 ENCOUNTER — Telehealth

## 2017-10-01 NOTE — Telephone Encounter (Signed)
Spoke to pt and advised him that it was time for his follow up. Appt made for pt for 10/26/17 at 2:300pm. Pt also informed that he will need tests completed before that appt. Pt verbalized understanding. Orders at the front desk for pick up.

## 2017-10-13 LAB — PSA, DIAGNOSTIC (PROSTATE SPECIFIC AG): Prostate Specific Ag: 0.4 ng/mL (ref 0.0–3.0)

## 2017-10-14 ENCOUNTER — Encounter

## 2017-10-26 ENCOUNTER — Ambulatory Visit: Admit: 2017-10-26 | Discharge: 2017-10-26 | Attending: Urology

## 2017-10-26 DIAGNOSIS — D400 Neoplasm of uncertain behavior of prostate: Secondary | ICD-10-CM

## 2017-10-26 LAB — AMB POC URINALYSIS DIP STICK AUTO W/ MICRO (MICRO RESULTS)
Bilirubin (UA POC): NEGATIVE
Blood (UA POC): NEGATIVE
Crystals (UA POC): NEGATIVE
Epithelial cells (UA POC): 0
Glucose (UA POC): NEGATIVE
Leukocyte esterase (UA POC): NEGATIVE
Nitrites (UA POC): NEGATIVE
Protein (UA POC): NEGATIVE
RBCs (UA POC): 0
Specific gravity (UA POC): 1.025 (ref 1.001–1.035)
Urobilinogen (UA POC): 1 (ref 0.2–1)
WBCs (UA POC): 0
pH (UA POC): 6.5 (ref 4.6–8.0)

## 2017-10-26 MED ORDER — POTASSIUM CITRATE SR 10 MEQ (1,080 MG) TAB
10 mEq (1,080 mg) | ORAL_TABLET | Freq: Two times a day (BID) | ORAL | 12 refills | Status: AC
Start: 2017-10-26 — End: ?

## 2017-10-26 MED ORDER — TAMSULOSIN SR 0.4 MG 24 HR CAP
0.4 mg | ORAL_CAPSULE | Freq: Every evening | ORAL | 12 refills | Status: DC
Start: 2017-10-26 — End: 2018-03-17

## 2017-10-26 NOTE — Progress Notes (Signed)
HISTORY OF PRESENT ILLNESS:  Stanley Young is a 57 y.o. male who presents today for 1 yr f/u BPH and hx of stones, on Flomax and K citrate. KUB neg. PSA 0.4. Hx of firm prostate and high PTH - sees ENT. 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     Pt reports doing well. Nocturia x1-2. Does have some episodes of urgency and post-void dribbling. Pt has stopped Flomax and K citrate.     AUA Symptom Score 10/26/2017   Over the past month how often have you had the sensation that your bladder was not completely empty after you finished urinating? 3   Over the past month, how often have had to urinate again less than 2 hours after you last finished urinating? 2   Over the past month, how often have you found you stopped and started again several times when you urinated? 2   Over the past month, how often have you found it difficult to postpone urination? 4   Over the past month, how often have you had a weak urinary stream? 4   Over the past month, how often have you had to push or strain to begin urinating? 2   Over the past month, how many times did you most typically get up to urinate from the time you went to bed at night until the time you got up in the morning? 2   AUA Score 19   If you were to spend the rest of your life with your urinary condition the way it is now, how would you feel about that? Mixed-about equally satisfied       Past Medical History:   Diagnosis Date   ??? Asthma    ??? BPH (benign prostatic hypertrophy)    ??? Hypertension    ??? Kidney stone    ??? Prostate neoplasm        Past Surgical History:   Procedure Laterality Date   ??? FRAGMENT KIDNEY STONE/ ESWL     ??? LASER VAPORIZATION SURGERY PROSTATE, COMPLETE         Social History     Tobacco Use   ??? Smoking status: Never Smoker   ??? Smokeless tobacco: Never Used   Substance Use Topics   ??? Alcohol use: No   ??? Drug use: No       Allergies    Allergen Reactions   ??? Codeine Unable to Obtain   ??? Hydrocodone Not Reported This Time   ??? Oxycodone Itching       Family History   Problem Relation Age of Onset   ??? Alzheimer Mother    ??? Cancer Father    ??? Heart Attack Father        Current Outpatient Medications   Medication Sig Dispense Refill   ??? lisinopril (PRINIVIL, ZESTRIL) 40 mg tablet      ??? potassium citrate (UROCIT-K10) 10 mEq (1,080 mg) TbER Take 1 Tab by mouth two (2) times a day. 60 Tab 12   ??? tamsulosin (FLOMAX) 0.4 mg capsule Take 1 Cap by mouth nightly. 30 Cap 12       REVIEW OF SYSTEMS:  Constitutional: Fever: No  Skin: Rash: No  HEENT: Hearing difficulty: No  Eyes: Blurred vision: No  Cardiovascular: Chest pain: No  Respiratory: Shortness of breath: No  Gastrointestinal: Nausea/vomiting: No  Musculoskeletal: Back pain: No  Neurological: Weakness: No  Psychological: Memory loss: No  Comments/additional findings:       PHYSICAL EXAMINATION:   Visit Vitals  BP (!) 193/112   Pulse 95   Temp 96.9 ??F (36.1 ??C)   Resp 18   Ht 6' (1.829 m)   Wt 239 lb 6.4 oz (108.6 kg)   BMI 32.47 kg/m??     Constitutional: Well developed, no acute distress.   Eyes:  Conjunctiva normal.  Ears:  External ear normal.  Nose/Throat:  External nose normal.  CV:  Heart rate regular. No peripheral swelling noted.  Respiratory: No respiratory distress. No audible wheeze.  Abdomen:  Soft, nontender, nondistended, no mass/organomegaly.  Vascular:  Abdominal Aorta nonpalpable.  Skin: No rash. No ulcer.    Neuro/Psych:  Patient with appropriate affect. Alert and oriented x 3.    Gait:  Normal.  GU Male:     CVA: non-tender bilaterally. Bladder not palpable.  DRE: Perineum normal to visual inspection. Sphincter with good tone. Rectum with no masses. Prostate is moderately enlarged and generally firm.  SCROTUM:  No scrotal rash or lesions. Normal right testis and epididymidis - left testicle absent. No inguinal hernias noted.   PENIS: Urethral meatus normal in location and size. No urethral discharge.      REVIEW OF LABS AND IMAGING:      Results for orders placed or performed in visit on 10/26/17   AMB POC URINALYSIS DIP STICK AUTO W/ MICRO (MICRO RESULTS)   Result Value Ref Range    Color (UA POC) Yellow     Clarity (UA POC) Clear     Glucose (UA POC) Negative Negative    Bilirubin (UA POC) Negative Negative    Ketones (UA POC) Trace Negative    Specific gravity (UA POC) 1.025 1.001 - 1.035    Blood (UA POC) Negative Negative    pH (UA POC) 6.5 4.6 - 8.0    Protein (UA POC) Negative Negative    Urobilinogen (UA POC) 1 mg/dL 0.2 - 1    Nitrites (UA POC) Negative Negative    Leukocyte esterase (UA POC) Negative Negative    Epithelial cells (UA POC) 0     WBCs (UA POC) 0      RBCs (UA POC) 0      Bacteria (UA POC) None Negative    Crystals (UA POC) Negative Negative    Other (UA POC)         ASSESSMENT:     ICD-10-CM ICD-9-CM    1. 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 Personal history of urinary calculi Z87.442 V13.01    3. Benign prostatic hyperplasia with nocturia N40.1 600.01     R35.1 788.43        PLAN:    ?? Rx K citrate 10 mEq BID  ?? Rx Flomax 0.4mg   ?? Nurse visit in 3 and 6 wks for pH/Chem 7  ?? F/u 2 months to reassess    Patient's BMI is out of the normal parameters. Information about BMI was given and patient was advised to follow-up with their PCP for further management.    Chief Complaint   Patient presents with   ??? Benign Prostatic Hypertrophy     Medical documentation provided with the assistance of Lonell Grandchild, medical scribe for [employer], [place], [place].    Sephora Boyar [place], DO

## 2017-11-16 ENCOUNTER — Institutional Professional Consult (permissible substitution): Admit: 2017-11-16 | Discharge: 2017-11-16

## 2017-11-16 DIAGNOSIS — N200 Calculus of kidney: Secondary | ICD-10-CM

## 2017-11-16 LAB — AMB POC URINALYSIS DIP STICK AUTO W/ MICRO (MICRO RESULTS)
Blood (UA POC): NEGATIVE
Glucose (UA POC): NEGATIVE
Leukocyte esterase (UA POC): NEGATIVE
Nitrites (UA POC): NEGATIVE
Protein (UA POC): NEGATIVE
Specific gravity (UA POC): 1.025 (ref 1.001–1.035)
Urobilinogen (UA POC): 0.2 (ref 0.2–1)
pH (UA POC): 5.5 (ref 4.6–8.0)

## 2017-11-16 NOTE — Progress Notes (Signed)
Stanley Young is here today per Dr. Candi Leash give a urine specimen.       He is here for urine ph      Urine is obtained from patient via clean catch.     Patient is symptomatic: no  Patient complains of: na   Patient denies na    Urine was not sent for culture.   This is not a repeat urine culture.   UA performed: yes    Results for orders placed or performed in visit on 10/26/17   AMB POC URINALYSIS DIP STICK AUTO W/ MICRO (MICRO RESULTS)   Result Value Ref Range    Color (UA POC) Yellow     Clarity (UA POC) Clear     Glucose (UA POC) Negative Negative    Bilirubin (UA POC) Negative Negative    Ketones (UA POC) Trace Negative    Specific gravity (UA POC) 1.025 1.001 - 1.035    Blood (UA POC) Negative Negative    pH (UA POC) 6.5 4.6 - 8.0    Protein (UA POC) Negative Negative    Urobilinogen (UA POC) 1 mg/dL 0.2 - 1    Nitrites (UA POC) Negative Negative    Leukocyte esterase (UA POC) Negative Negative    Epithelial cells (UA POC) 0     WBCs (UA POC) 0      RBCs (UA POC) 0      Bacteria (UA POC) None Negative    Crystals (UA POC) Negative Negative    Other (UA POC)            Orders Placed This Encounter   ??? BASIC METABOLIC PANEL     Standing Status:   Future     Number of Occurrences:   1     Standing Expiration Date:   05/16/2018   ??? AMB POC URINALYSIS DIP STICK AUTO W/ MICRO (MICRO RESULTS)   ??? COLLECTION VENOUS BLOOD,VENIPUNCTURE       [name], LPN   Reviewed by [employer], DO.

## 2017-11-16 NOTE — Progress Notes (Signed)
Stanley Young presents today for lab draw per Dr. Reinaldo Raddle order.   Dr. Reinaldo Raddle was present in the clinic as incident to.     BMP obtained via venipuncture without any difficulty.    Patient will be notified with lab results.     No orders of the defined types were placed in this encounter.      [employer], LPN

## 2017-11-17 LAB — METABOLIC PANEL, BASIC
BUN/Creatinine ratio: 10 (ref 9–20)
BUN: 12 mg/dL (ref 6–24)
CO2: 21 mmol/L (ref 20–29)
Calcium: 9.6 mg/dL (ref 8.7–10.2)
Chloride: 105 mmol/L (ref 96–106)
Creatinine: 1.19 mg/dL (ref 0.76–1.27)
GFR est AA: 77 mL/min/{1.73_m2} (ref >59)
GFR est non-AA: 67 mL/min/{1.73_m2} (ref >59)
Glucose: 80 mg/dL (ref 65–99)
Potassium: 4.4 mmol/L (ref 3.5–5.2)
Sodium: 144 mmol/L (ref 134–144)

## 2017-12-07 ENCOUNTER — Encounter

## 2017-12-14 ENCOUNTER — Encounter

## 2017-12-23 ENCOUNTER — Encounter: Attending: Urology

## 2017-12-31 ENCOUNTER — Encounter: Attending: Urology

## 2018-02-03 DIAGNOSIS — R9439 Abnormal result of other cardiovascular function study: Secondary | ICD-10-CM | POA: Insufficient documentation

## 2018-02-04 ENCOUNTER — Encounter: Attending: Urology

## 2018-02-26 LAB — PSA, DIAGNOSTIC (PROSTATE SPECIFIC AG): Prostate Specific Ag: 0.4 ng/mL

## 2018-02-26 LAB — PSA PROSTATIC SPECIFIC ANTIGEN: PSA: 0.4 ng/mL

## 2018-03-17 ENCOUNTER — Ambulatory Visit: Attending: Urology

## 2018-03-17 ENCOUNTER — Ambulatory Visit: Admit: 2018-03-17 | Discharge: 2018-03-17 | Attending: Urology

## 2018-03-17 DIAGNOSIS — N401 Enlarged prostate with lower urinary tract symptoms: Secondary | ICD-10-CM

## 2018-03-17 MED ORDER — TAMSULOSIN SR 0.4 MG 24 HR CAP
0.4 mg | ORAL_CAPSULE | Freq: Every evening | ORAL | 12 refills | Status: DC
Start: 2018-03-17 — End: 2018-04-16

## 2018-03-17 NOTE — Progress Notes (Signed)
Progress Notes by Lavone Neri, DO at 03/17/18 1015                Author: Lavone Neri, DO  Service: --  Author Type: Physician       Filed: 03/18/18 0931  Encounter Date: 03/17/2018  Status: Signed          Editor: Lavone Neri, DO (Physician)                          HISTORY OF PRESENT ILLNESS:  Stanley Young  is a 57 y.o. male who presents  today for 2 mo f/u BPH and hx of stones. Was on Flomax and K citrate, but had stopped so restarted last visit. PSA 0.4. Cr 1.3. Potassium 4.8. Hx of firm prostate and high PTH - sees ENT. 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       Pt reports doing well. Nocturia still x1-2. Still having some episodes of urgency and post-void dribbling.          AUA Symptom Score  03/17/2018        Over the past month how often have you had the sensation that your bladder was not completely empty after you finished urinating?  3     Over the past month, how often have had to urinate again less than 2 hours after you last finished urinating?  3     Over the past month, how often have you found you stopped and started again several times when you urinated?  5     Over the past month, how often have you found it difficult to postpone urination?  4     Over the past month, how often have you had a weak urinary stream?  3     Over the past month, how often have you had to push or strain to begin urinating?  3     Over the past month, how many times did you most typically get up to urinate from the time you went to bed at night until the time you got up in  the morning?  3     AUA Score  24        If you were to spend the rest of your life with your urinary condition the way it is now, how would you feel about that?  Mostly dissatisfied             Past Medical History:        Diagnosis  Date         ?  Asthma       ?  BPH (benign prostatic hypertrophy)       ?  Heart abnormality        ?  Hypertension       ?  Kidney stone           ?  Prostate neoplasm               Past Surgical History:         Procedure  Laterality  Date          ?  FRAGMENT KIDNEY STONE/ ESWL              ?  LASER VAPORIZATION SURGERY PROSTATE, COMPLETE  Social History          Tobacco Use         ?  Smoking status:  Never Smoker     ?  Smokeless tobacco:  Never Used       Substance Use Topics         ?  Alcohol use:  No         ?  Drug use:  No             Allergies        Allergen  Reactions         ?  Codeine  Unable to Obtain     ?  Hydrocodone  Not Reported This Time         ?  Oxycodone  Itching             Family History         Problem  Relation  Age of Onset          ?  Alzheimer  Mother       ?  Cancer  Father            ?  Heart Attack  Father               Current Outpatient Medications          Medication  Sig  Dispense  Refill           ?  atorvastatin (LIPITOR) 80 mg tablet  Take 80 mg by mouth.         ?  aspirin 81 mg chewable tablet  Take 81 mg by mouth.         ?  nitroglycerin (NITROSTAT) 0.4 mg SL tablet  0.4 mg by SubLINGual route.         ?  tamsulosin (FLOMAX) 0.4 mg capsule  Take 2 Caps by mouth nightly.  60 Cap  12     ?  amLODIPine (NORVASC) 5 mg tablet      2     ?  lisinopril (PRINIVIL, ZESTRIL) 40 mg tablet                 ?  potassium citrate (UROCIT-K10) 10 mEq (1,080 mg) TbER  Take 1 Tab by mouth two (2) times a day.  60 Tab  12           REVIEW OF SYSTEMS:   Constitutional: Fever: No   Skin: Rash: No   HEENT: Hearing difficulty: No   Eyes: Blurred vision: No   Cardiovascular: Chest pain: Yes   Respiratory: Shortness of breath: No   Gastrointestinal: Nausea/vomiting: No   Musculoskeletal: Back pain: No   Neurological: Weakness: No   Psychological: Memory loss: No   Comments/additional findings:          PHYSICAL EXAMINATION:    Visit Vitals      BP  145/53     Pulse  84     Temp  97.2 ??F (36.2 ??C)     Resp  18     Ht  6' (1.829 m)     Wt  231 lb (104.8 kg)        BMI   31.33 kg/m??        Constitutional: Well developed, no acute distress.    Eyes:  Conjunctiva normal.   Ears:  External ear normal.   Nose/Throat:  External nose normal.   CV:  Heart rate  regular.  No peripheral swelling noted.   Respiratory: No respiratory distress. No audible wheeze.   Skin: No rash. No ulcer.      Neuro/Psych:  Patient with appropriate affect. Alert and oriented x 3.      Gait:  Normal.         REVIEW OF LABS AND IMAGING:           PSA /TESTOSTERONE - BSHSI  PSA     Latest Ref Rng & Units  ng/mL        02/26/2018  0.4     11/16/2017       10/13/2017  0.4     06/30/2016  0.5           ASSESSMENT:              ICD-10-CM  ICD-9-CM             1.  Benign prostatic hyperplasia with nocturia  N40.1  600.01              R35.1  788.43             2.  Neoplasm of uncertain behavior of prostate  D40.0  236.5             3.  Personal history of urinary calculi  Z87.442  V13.01             PLAN:     ??  Increase Flomax to 0.8mg    ??  Cont K citrate 10 mEq BID   ??  F/u 1 month to reassess   ??  Full exam in January   ??  Chem 7 and PSA in May       Patient's BMI is out of the normal parameters. Information about BMI was given and patient was advised to follow-up with their PCP for further management.        Chief Complaint       Patient presents with        ?  Benign Prostatic Hypertrophy        ?  Kidney [employer] documentation provided with the assistance of Lonell Grandchild, medical  scribe for [employer], DO, [place].      Stanley Bais [place], DO

## 2018-03-17 NOTE — Progress Notes (Signed)
HISTORY OF PRESENT ILLNESS:  Stanley Young is a 57 y.o. male who presents today for 2 mo f/u BPH and hx of stones. Was on Flomax and K citrate, but had stopped so restarted last visit. PSA 0.4. Cr 1.3. Potassium 4.8. Hx of firm prostate and high PTH - sees ENT. 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     Pt reports doing well. Nocturia still x1-2. Still having some episodes of urgency and post-void dribbling.     AUA Symptom Score 03/17/2018   Over the past month how often have you had the sensation that your bladder was not completely empty after you finished urinating? 3   Over the past month, how often have had to urinate again less than 2 hours after you last finished urinating? 3   Over the past month, how often have you found you stopped and started again several times when you urinated? 5   Over the past month, how often have you found it difficult to postpone urination? 4   Over the past month, how often have you had a weak urinary stream? 3   Over the past month, how often have you had to push or strain to begin urinating? 3   Over the past month, how many times did you most typically get up to urinate from the time you went to bed at night until the time you got up in the morning? 3   AUA Score 24   If you were to spend the rest of your life with your urinary condition the way it is now, how would you feel about that? Mostly dissatisfied       Past Medical History:   Diagnosis Date   ??? Asthma    ??? BPH (benign prostatic hypertrophy)    ??? Heart abnormality    ??? Hypertension    ??? Kidney stone    ??? Prostate neoplasm        Past Surgical History:   Procedure Laterality Date   ??? FRAGMENT KIDNEY STONE/ ESWL     ??? LASER VAPORIZATION SURGERY PROSTATE, COMPLETE         Social History     Tobacco Use   ??? Smoking status: Never Smoker   ??? Smokeless tobacco: Never Used   Substance Use Topics   ??? Alcohol use: No    ??? Drug use: No       Allergies   Allergen Reactions   ??? Codeine Unable to Obtain   ??? Hydrocodone Not Reported This Time   ??? Oxycodone Itching       Family History   Problem Relation Age of Onset   ??? Alzheimer Mother    ??? Cancer Father    ??? Heart Attack Father        Current Outpatient Medications   Medication Sig Dispense Refill   ??? atorvastatin (LIPITOR) 80 mg tablet Take 80 mg by mouth.     ??? aspirin 81 mg chewable tablet Take 81 mg by mouth.     ??? nitroglycerin (NITROSTAT) 0.4 mg SL tablet 0.4 mg by SubLINGual route.     ??? tamsulosin (FLOMAX) 0.4 mg capsule Take 2 Caps by mouth nightly. 60 Cap 12   ??? amLODIPine (NORVASC) 5 mg tablet   2   ??? lisinopril (PRINIVIL, ZESTRIL) 40 mg tablet      ??? potassium citrate (UROCIT-K10) 10 mEq (1,080 mg)  TbER Take 1 Tab by mouth two (2) times a day. 60 Tab 12       REVIEW OF SYSTEMS:  Constitutional: Fever: No  Skin: Rash: No  HEENT: Hearing difficulty: No  Eyes: Blurred vision: No  Cardiovascular: Chest pain: Yes  Respiratory: Shortness of breath: No  Gastrointestinal: Nausea/vomiting: No  Musculoskeletal: Back pain: No  Neurological: Weakness: No  Psychological: Memory loss: No  Comments/additional findings:       PHYSICAL EXAMINATION:   Visit Vitals  BP 145/53   Pulse 84   Temp 97.2 ??F (36.2 ??C)   Resp 18   Ht 6' (1.829 m)   Wt 231 lb (104.8 kg)   BMI 31.33 kg/m??     Constitutional: Well developed, no acute distress.   Eyes:  Conjunctiva normal.  Ears:  External ear normal.  Nose/Throat:  External nose normal.  CV:  Heart rate regular. No peripheral swelling noted.  Respiratory: No respiratory distress. No audible wheeze.  Skin: No rash. No ulcer.    Neuro/Psych:  Patient with appropriate affect. Alert and oriented x 3.    Gait:  Normal.      REVIEW OF LABS AND IMAGING:      PSA /TESTOSTERONE - BSHSI PSA   Latest Ref Rng & Units ng/mL   02/26/2018 0.4   11/16/2017    10/13/2017 0.4   06/30/2016 0.5       ASSESSMENT:     ICD-10-CM ICD-9-CM     1. Benign prostatic hyperplasia with nocturia N40.1 600.01     R35.1 788.43    2. 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 Personal history of urinary calculi Z87.442 V13.01        PLAN:    ?? Increase Flomax to 0.8mg   ?? Cont K citrate 10 mEq BID  ?? F/u 1 month to reassess  ?? Full exam in January  ?? Chem 7 and PSA in May     Patient's BMI is out of the normal parameters. Information about BMI was given and patient was advised to follow-up with their PCP for further management.    Chief Complaint   Patient presents with   ??? Benign Prostatic Hypertrophy   ??? Kidney [employer] documentation provided with the assistance of Lonell Grandchild, medical scribe for [employer], DO, [place].    Jaber Dunlow [place], DO

## 2018-03-24 NOTE — Telephone Encounter (Signed)
Pt called and said he had inncreased Flomax to 0.8mg  per Dr. Glenford Peers recommendation. Pt stated that ever since he increased the dosage, he's been getting dizzy spells. I advised him to go back to 0.4mg  and I would forward the information to Dr. Reinaldo Raddle.

## 2018-04-06 DIAGNOSIS — I2510 Atherosclerotic heart disease of native coronary artery without angina pectoris: Secondary | ICD-10-CM | POA: Insufficient documentation

## 2018-04-06 DIAGNOSIS — E785 Hyperlipidemia, unspecified: Secondary | ICD-10-CM | POA: Insufficient documentation

## 2018-04-16 ENCOUNTER — Ambulatory Visit: Attending: Urology

## 2018-04-16 ENCOUNTER — Ambulatory Visit: Admit: 2018-04-16 | Discharge: 2018-04-16 | Attending: Urology

## 2018-04-16 DIAGNOSIS — N401 Enlarged prostate with lower urinary tract symptoms: Secondary | ICD-10-CM

## 2018-04-16 MED ORDER — SILODOSIN 8 MG CAPSULE
8 mg | ORAL_CAPSULE | Freq: Every day | ORAL | 12 refills | Status: AC
Start: 2018-04-16 — End: ?

## 2018-04-16 NOTE — Progress Notes (Signed)
Progress Notes by Lavone Neri, DO at 04/16/18 1015                Author: Lavone Neri, DO  Service: --  Author Type: Physician       Filed: 04/19/18 1538  Encounter Date: 04/16/2018  Status: Signed          Editor: Lavone Neri, DO (Physician)                          HISTORY OF PRESENT ILLNESS:  Stanley Young  is a 57 y.o. male who presents  today for 1 mo f/u BPH, Flomax increased to 0.8mg  last visit. Hx of stones, on K citrate. Hx of firm prostate and high PTH - sees ENT. 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       Pt reports that Flomax caused severe dizziness.          AUA Symptom Score  04/16/2018        Over the past month how often have you had the sensation that your bladder was not completely empty after you finished urinating?  3     Over the past month, how often have had to urinate again less than 2 hours after you last finished urinating?  4     Over the past month, how often have you found you stopped and started again several times when you urinated?  3     Over the past month, how often have you found it difficult to postpone urination?  4     Over the past month, how often have you had a weak urinary stream?  4     Over the past month, how often have you had to push or strain to begin urinating?  3     Over the past month, how many times did you most typically get up to urinate from the time you went to bed at night until the time you got up in  the morning?  3     AUA Score  24        If you were to spend the rest of your life with your urinary condition the way it is now, how would you feel about that?  Mostly dissatisfied             Past Medical History:        Diagnosis  Date         ?  Asthma       ?  BPH (benign prostatic hypertrophy)       ?  Heart abnormality       ?  Hypertension       ?  Kidney stone           ?  Prostate neoplasm               Past Surgical History:          Procedure  Laterality  Date          ?  FRAGMENT KIDNEY STONE/ ESWL              ?  LASER VAPORIZATION SURGERY PROSTATE, COMPLETE                 Social History          Tobacco Use         ?  Smoking status:  Never Smoker     ?  Smokeless tobacco:  Never Used       Substance Use Topics         ?  Alcohol use:  No         ?  Drug use:  No             Allergies        Allergen  Reactions         ?  Codeine  Unable to Obtain     ?  Hydrocodone  Not Reported This Time         ?  Oxycodone  Itching             Family History         Problem  Relation  Age of Onset          ?  Alzheimer  Mother       ?  Cancer  Father            ?  Heart Attack  Father               Current Outpatient Medications          Medication  Sig  Dispense  Refill           ?  silodosin (RAPAFLO) 8 mg capsule  Take 1 Cap by mouth daily (with breakfast).  30 Cap  12     ?  atorvastatin (LIPITOR) 80 mg tablet  Take 80 mg by mouth.         ?  aspirin 81 mg chewable tablet  Take 81 mg by mouth.         ?  amLODIPine (NORVASC) 5 mg tablet      2     ?  nitroglycerin (NITROSTAT) 0.4 mg SL tablet  0.4 mg by SubLINGual route.         ?  lisinopril (PRINIVIL, ZESTRIL) 40 mg tablet                 ?  potassium citrate (UROCIT-K10) 10 mEq (1,080 mg) TbER  Take 1 Tab by mouth two (2) times a day.  60 Tab  12           REVIEW OF SYSTEMS:   Constitutional: Fever: No   Skin: Rash: No   HEENT: Hearing difficulty: No   Eyes: Blurred vision: No   Cardiovascular: Chest pain: No   Respiratory: Shortness of breath: No   Gastrointestinal: Nausea/vomiting: No   Musculoskeletal: Back pain: No   Neurological: Weakness: No   Psychological: Memory loss: No   Comments/additional findings:          PHYSICAL EXAMINATION:    Visit Vitals      BP  126/83     Pulse  83     Temp  97.9 ??F (36.6 ??C)     Resp  18     Ht  6' (1.829 m)     Wt  226 lb (102.5 kg)        BMI  30.65 kg/m??        Constitutional: Well developed, no acute distress.    Eyes: Conjunctiva normal.    Ears: External ear normal.   Nose/Throat: External nose normal.   CV: Heart rate regular.  No peripheral swelling noted.   Respiratory: No respiratory distress. No audible wheeze.   Skin: No rash. No ulcer.  Neuro/Psych: Patient with appropriate affect. Alert and oriented x 3.      Gait: Normal.         REVIEW OF LABS AND IMAGING:           PSA /TESTOSTERONE - [facility]  PSA     Latest Ref Rng & Units  ng/mL        02/26/2018  0.4     11/16/2017       10/13/2017  0.4     06/30/2016  0.5           ASSESSMENT:              ICD-10-CM  ICD-9-CM             1.  Benign prostatic hyperplasia with nocturia  N40.1  600.01              R35.1  788.43             2.  Personal history of urinary calculi  Z87.442  V13.01       3.  Neoplasm of uncertain behavior of prostate  D40.0  236.5             4.  Ureteropelvic junction (UPJ) obstruction  N13.5  593.4             PLAN:     ??  Rx Rapaflo 8mg    ??  Cont K citrate 10 mEq BID   ??  If able to get Rapaflo, f/u in 1 month   ??  If not, will get in for UroLift screening   ??  Full exam in January   ??  Chem 7 and PSA in May       Patient's BMI is out of the normal parameters. Information about BMI was given and patient was advised to follow-up with their PCP for further management.        Chief Complaint       Patient presents with        ?  Benign Prostatic Hypertrophy        Medical documentation provided with the assistance of Lonell Grandchild, medical  scribe for [employer], DO, [place].      Stanley Mckiernan [place], DO

## 2018-04-16 NOTE — Progress Notes (Signed)
HISTORY OF PRESENT ILLNESS:  Stanley Young is a 57 y.o. male who presents today for 1 mo f/u BPH, Flomax increased to 0.8mg  last visit. Hx of stones, on K citrate. Hx of firm prostate and high PTH - sees ENT. 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     Pt reports that Flomax caused severe dizziness.     AUA Symptom Score 04/16/2018   Over the past month how often have you had the sensation that your bladder was not completely empty after you finished urinating? 3   Over the past month, how often have had to urinate again less than 2 hours after you last finished urinating? 4   Over the past month, how often have you found you stopped and started again several times when you urinated? 3   Over the past month, how often have you found it difficult to postpone urination? 4   Over the past month, how often have you had a weak urinary stream? 4   Over the past month, how often have you had to push or strain to begin urinating? 3   Over the past month, how many times did you most typically get up to urinate from the time you went to bed at night until the time you got up in the morning? 3   AUA Score 24   If you were to spend the rest of your life with your urinary condition the way it is now, how would you feel about that? Mostly dissatisfied       Past Medical History:   Diagnosis Date   ??? Asthma    ??? BPH (benign prostatic hypertrophy)    ??? Heart abnormality    ??? Hypertension    ??? Kidney stone    ??? Prostate neoplasm        Past Surgical History:   Procedure Laterality Date   ??? FRAGMENT KIDNEY STONE/ ESWL     ??? LASER VAPORIZATION SURGERY PROSTATE, COMPLETE         Social History     Tobacco Use   ??? Smoking status: Never Smoker   ??? Smokeless tobacco: Never Used   Substance Use Topics   ??? Alcohol use: No   ??? Drug use: No       Allergies   Allergen Reactions   ??? Codeine Unable to Obtain    ??? Hydrocodone Not Reported This Time   ??? Oxycodone Itching       Family History   Problem Relation Age of Onset   ??? Alzheimer Mother    ??? Cancer Father    ??? Heart Attack Father        Current Outpatient Medications   Medication Sig Dispense Refill   ??? silodosin (RAPAFLO) 8 mg capsule Take 1 Cap by mouth daily (with breakfast). 30 Cap 12   ??? atorvastatin (LIPITOR) 80 mg tablet Take 80 mg by mouth.     ??? aspirin 81 mg chewable tablet Take 81 mg by mouth.     ??? amLODIPine (NORVASC) 5 mg tablet   2   ??? nitroglycerin (NITROSTAT) 0.4 mg SL tablet 0.4 mg by SubLINGual route.     ??? lisinopril (PRINIVIL, ZESTRIL) 40 mg tablet      ??? potassium citrate (UROCIT-K10) 10 mEq (1,080 mg) TbER Take 1 Tab by mouth two (2) times a day. 60 Tab 12  REVIEW OF SYSTEMS:  Constitutional: Fever: No  Skin: Rash: No  HEENT: Hearing difficulty: No  Eyes: Blurred vision: No  Cardiovascular: Chest pain: No  Respiratory: Shortness of breath: No  Gastrointestinal: Nausea/vomiting: No  Musculoskeletal: Back pain: No  Neurological: Weakness: No  Psychological: Memory loss: No  Comments/additional findings:       PHYSICAL EXAMINATION:   Visit Vitals  BP 126/83   Pulse 83   Temp 97.9 ??F (36.6 ??C)   Resp 18   Ht 6' (1.829 m)   Wt 226 lb (102.5 kg)   BMI 30.65 kg/m??     Constitutional: Well developed, no acute distress.   Eyes: Conjunctiva normal.  Ears: External ear normal.  Nose/Throat: External nose normal.  CV: Heart rate regular. No peripheral swelling noted.  Respiratory: No respiratory distress. No audible wheeze.  Skin: No rash. No ulcer.    Neuro/Psych: Patient with appropriate affect. Alert and oriented x 3.    Gait: Normal.      REVIEW OF LABS AND IMAGING:      PSA /TESTOSTERONE - [facility] PSA   Latest Ref Rng & Units ng/mL   02/26/2018 0.4   11/16/2017    10/13/2017 0.4   06/30/2016 0.5       ASSESSMENT:     ICD-10-CM ICD-9-CM    1. Benign prostatic hyperplasia with nocturia N40.1 600.01     R35.1 788.43     2. Personal history of urinary calculi Z87.442 V13.01    3. Neoplasm of uncertain behavior of prostate D40.0 236.5    4. Ureteropelvic junction (UPJ) obstruction N13.5 593.4        PLAN:    ?? Rx Rapaflo 8mg   ?? Cont K citrate 10 mEq BID  ?? If able to get Rapaflo, f/u in 1 month  ?? If not, will get in for UroLift screening  ?? Full exam in January  ?? Chem 7 and PSA in May     Patient's BMI is out of the normal parameters. Information about BMI was given and patient was advised to follow-up with their PCP for further management.    Chief Complaint   Patient presents with   ??? Benign Prostatic Hypertrophy     Medical documentation provided with the assistance of Lonell Grandchild, medical scribe for [employer], [place], [place].    Nuh Lipton [place], DO

## 2018-05-19 ENCOUNTER — Encounter: Attending: Urology

## 2018-05-24 NOTE — Progress Notes (Signed)
Pt called would like a phone call to discuss up coming appt,states he wants to decide on having an procedure,because medication isn't working and it didn't make sense to pay a 50.00 copayment just to talk.

## 2018-05-24 NOTE — Telephone Encounter (Signed)
Called and spoke with patient and  He states he wants to go ahead w urolift screen as rapaflo didn't work.  So his appt was changed to procedure appt on 05/26/18

## 2018-05-26 ENCOUNTER — Ambulatory Visit: Attending: Urology

## 2018-05-26 ENCOUNTER — Encounter

## 2018-05-26 ENCOUNTER — Ambulatory Visit: Admit: 2018-05-26 | Discharge: 2018-05-26 | Attending: Urology

## 2018-05-26 DIAGNOSIS — N401 Enlarged prostate with lower urinary tract symptoms: Secondary | ICD-10-CM

## 2018-05-26 LAB — AMB POC URINALYSIS DIP STICK AUTO W/ MICRO (MICRO RESULTS)
Crystals (UA POC): NEGATIVE
Crystals (UA POC): NEGATIVE
Epi Cells Urine, POC: 0 NA
Epithelial cells (UA POC): 0
Glucose (UA POC): NEGATIVE
Glucose, Urine, POC: NEGATIVE
Leukocyte Esterase, Urine, POC: NEGATIVE
Leukocyte esterase (UA POC): NEGATIVE
Nitrite, Urine, POC: NEGATIVE
Nitrites (UA POC): NEGATIVE
Protein (UA POC): NEGATIVE
Protein, Urine, POC: NEGATIVE
RBC, Urine, POC: 0
RBCs (UA POC): 0
Specific Gravity, Urine, POC: 1.02 NA (ref 1.001–1.035)
Specific gravity (UA POC): 1.02 (ref 1.001–1.035)
Urobilinogen (UA POC): 1 (ref 0.2–1)
Urobilinogen, POC: 1 (ref 0.2–1)
WBC, Urine, POC: 0
WBCs (UA POC): 0
pH (UA POC): 6.5 (ref 4.6–8.0)
pH, Urine, POC: 6.5 NA (ref 4.6–8.0)

## 2018-05-26 NOTE — Progress Notes (Signed)
Progress Notes by Lavone Neri, DO at 05/26/18 0845                Author: Lavone Neri, DO  Service: --  Author Type: Physician       Filed: 05/26/18 1121  Encounter Date: 05/26/2018  Status: Signed          Editor: Lavone Neri, DO (Physician)                               HISTORY OF PRESENT ILLNESS:  Stanley Young  is a 57 y.o. male who presents  today for urolift screen for bph with nocturia 1-2, urgency, and post-void dribble.  Flomax caused dizziness and Rapaflo did not help. Firm prostate.  Hx stones on K citrate.   High PTH- seeing ENT.  Has mild right UPJO.  Found on screen today to have  moderate bph and volume 23g.            AUA Symptom Score  05/26/2018        Over the past month how often have you had the sensation that your bladder was not completely empty after you finished urinating?  3     Over the past month, how often have had to urinate again less than 2 hours after you last finished urinating?  4     Over the past month, how often have you found you stopped and started again several times when you urinated?  3     Over the past month, how often have you found it difficult to postpone urination?  4     Over the past month, how often have you had a weak urinary stream?  4     Over the past month, how often have you had to push or strain to begin urinating?  3     Over the past month, how many times did you most typically get up to urinate from the time you went to bed at night until the time you got up in  the morning?  3     AUA Score  24        If you were to spend the rest of your life with your urinary condition the way it is now, how would you feel about that?  Mostly dissatisfied             Past Medical History:        Diagnosis  Date         ?  Asthma       ?  BPH (benign prostatic hypertrophy)       ?  Heart abnormality       ?  Hypertension       ?  Kidney stone       ?  Prostate neoplasm           ?  Vasospasm [facility])               Past Surgical History:          Procedure  Laterality  Date          ?  FRAGMENT KIDNEY STONE/ ESWL         ?  HX HEART CATHETERIZATION              ?  LASER VAPORIZATION SURGERY PROSTATE, COMPLETE  Social History          Tobacco Use         ?  Smoking status:  Never Smoker     ?  Smokeless tobacco:  Never Used       Substance Use Topics         ?  Alcohol use:  No         ?  Drug use:  No             Allergies        Allergen  Reactions         ?  Codeine  Unable to Obtain     ?  Hydrocodone  Not Reported This Time         ?  Oxycodone  Itching             Family History         Problem  Relation  Age of Onset          ?  Alzheimer  Mother       ?  Cancer  Father            ?  Heart Attack  Father               Current Outpatient Medications          Medication  Sig  Dispense  Refill           ?  NIFEdipine ER (PROCARDIA XL) 30 mg ER tablet      0     ?  silodosin (RAPAFLO) 8 mg capsule  Take 1 Cap by mouth daily (with breakfast).  30 Cap  12     ?  atorvastatin (LIPITOR) 80 mg tablet  Take 80 mg by mouth.         ?  aspirin 81 mg chewable tablet  Take 81 mg by mouth.         ?  amLODIPine (NORVASC) 5 mg tablet      2     ?  nitroglycerin (NITROSTAT) 0.4 mg SL tablet  0.4 mg by SubLINGual route.               ?  potassium citrate (UROCIT-K10) 10 mEq (1,080 mg) TbER  Take 1 Tab by mouth two (2) times a day.  60 Tab  12              REVIEW OF SYSTEMS:   Constitutional: Fever: No   Skin: Rash: No   HEENT: Hearing difficulty: No   Eyes: Blurred vision: No   Cardiovascular: Chest pain: Yes   Respiratory: Shortness of breath: No   Gastrointestinal: Nausea/vomiting: No   Musculoskeletal: Back pain: No   Neurological: Weakness: No   Psychological: Memory loss: No   Comments/additional findings:          PHYSICAL EXAMINATION:    Visit Vitals      BP  (!) 140/94     Pulse  86     Temp  97.7 ??F (36.5 ??C)     Resp  18     Ht  6' (1.829 m)     Wt  219 lb 6.4 oz (99.5 kg)        BMI  29.76 kg/m??        Constitutional: Well developed, no acute  distress.    Eyes:  Conjunctiva normal.   Ears:  External ear normal.   Nose/Throat:  External nose normal.   CV:  Heart rate regular,  No peripheral swelling noted.   Respiratory: No respiratory distress, No audible wheeze.   Skin: No rash, No ulcer.      Neuro/Psych:  Patient with appropriate affect.  Alert and oriented x 3.      Gait:  Normal.   GU Male:     SCROTUM: No scrotal rash or lesions. Normal bilateral testes  and epididymides. No inguinal hernias noted.   PENIS: Urethral meatus normal in location and size. No urethral discharge .         REVIEW OF LABS AND IMAGING:          Results for orders placed or performed in visit on 05/26/18     AMB POC URINALYSIS DIP STICK AUTO W/ MICRO (MICRO RESULTS)         Result  Value  Ref Range            Color (UA POC)  Yellow         Clarity (UA POC)  Clear         Glucose (UA POC)  Negative  Negative       Bilirubin (UA POC)  1+  Negative       Ketones (UA POC)  Trace  Negative       Specific gravity (UA POC)  1.020  1.001 - 1.035       Blood (UA POC)  Trace  Negative       pH (UA POC)  6.5  4.6 - 8.0       Protein (UA POC)  Negative  Negative       Urobilinogen (UA POC)  1 mg/dL  0.2 - 1       Nitrites (UA POC)  Negative  Negative       Leukocyte esterase (UA POC)  Negative  Negative       Epithelial cells (UA POC)  0         WBCs (UA POC)  0          RBCs (UA POC)  0          Bacteria (UA POC)  None  Negative       Crystals (UA POC)  Negative  Negative            Other (UA POC)               ASSESSMENT:              ICD-10-CM  ICD-9-CM             1.  Benign prostatic hyperplasia with nocturia  N40.1  600.01  CYSTOURETHROSCOPY            R35.1  788.43  ECHO,TRANSRECTAL           AMB POC URINALYSIS DIP STICK AUTO W/ MICRO (MICRO RESULTS)           METABOLIC PANEL, BASIC           CBC W/O DIFF           CUSTOMER REQUEST URINE CULTURE           CANCELED: CBC W/O DIFF           2.  Preop testing  Z01.818  V72.84  EKG, 12 LEAD, INITIAL     3.  Personal history of urinary  calculi  Z87.442  V13.01  4.  Ureteropelvic junction (UPJ) obstruction  N13.5  593.4              PLAN:     ??  Discussed option of Urolift vs turp- patient wants to have urolift- explained risks/benefits.   ??  KUB Jan 2020.   ??  psa March 2020.         Patient's BMI is out of the normal parameters. Information about BMI was given and patient was advised to follow-up with their PCP for further management.        Chief Complaint       Patient presents with        ?  Benign Prostatic Hypertrophy        Medical documentation provided with the assistance of Lonell Grandchild, medical  scribe for [employer], DO, [place].      Devansh Riese [place], DO

## 2018-05-26 NOTE — Progress Notes (Signed)
Progress  Notes by Isac Sarna at 05/26/18 0845                Author: Isac Sarna  Service: --  Author TypeDagoberto Reef       Filed: 06/14/18 1036  Encounter Date: 05/26/2018  Status: Addendum          Editor: Estelle June, DO (Physician)          Related Notes: Original Note by Isac Sarna filed at 05/26/18 1121                          Urolift Screen      05/26/18      Patient Name: Stanley Young             Universal Procedure Pause:      1. Correct patient confirmed:  yes      2. Allergies confirmed:  yes      3. Patient taking antiplatelet medications:  yes      4. Patient taking anticoagulants:  no      5. Correct procedure and side confirmed and consent signed:  yes      6. Correct antibiotics confirmed:  no           Pre-procedure Diagnosis:              ICD-10-CM  ICD-9-CM             1.  Benign prostatic hyperplasia with nocturia  N40.1  600.01  CYSTOURETHROSCOPY            R35.1  788.43  ECHO,TRANSRECTAL           AMB POC URINALYSIS DIP STICK AUTO W/ MICRO (MICRO RESULTS)           METABOLIC PANEL, BASIC           CBC W/O DIFF           CUSTOMER REQUEST URINE CULTURE           CANCELED: CBC W/O DIFF           2.  Preop testing  Z01.818  V72.84  EKG, 12 LEAD, INITIAL     3.  Personal history of urinary calculi  Z87.442  V13.01             4.  Ureteropelvic junction (UPJ) obstruction  N13.5  593.4           Here for UroLift screen for BPH w/ nocturia x1-2, urgency, and post-void dribbling. Flomax caused dizziness and Rapaflo did not help. Hx of stones, on K citrate. Hx of firm prostate and high PTH - sees ENT. Was found to have right proximal ureteral thickening  and a left inguinal hernia, s/p repair and neg ureteroscopy except for mild UPJO.       Post-procedure Diagnosis: SAME      Consent:   All risks, benefits and options were reviewed in detail and the patient agrees to procedure. Risks include but are not limited to bleeding, infection, sepsis, death, dysuria and others.        Visit Vitals      BP  (!) 140/94     Pulse  86     Temp  97.7 F (36.5 C)     Resp  18     Ht  6' (1.829 m)     Wt  219 lb 6.4 oz (99.5 kg)  BMI  29.76 kg/m           Procedure:   The patient was placed in the supine position, and prepped and draped in the normal fashion. 5 ml of 2% Lidocaine gel was placed in the urethra. Once adequate anesthesia was achieved; the flexible cystoscope  was placed into the urethra.       Findings as follows:        Meatus: normal   Urethra: normal   Prostate: moderate obstruction   Median lobe: no   Bladder neck: normal   Trigone: normal   Trabeculation:normal   Diverticuli: none   Lesion: none      Antibiotic provided:  Yes          Transrectal ultrasound prostate volume determination:   Patient placed in left lateral decubitus position with knees flexed to chest.  Ultrasound wand placed in rectum after lubricated. Prostate volume: 23.39g.  Hypoechoic nodules: no         Lab / Imaging:      Results for orders placed or performed in visit on 05/26/18     AMB POC URINALYSIS DIP STICK AUTO W/ MICRO (MICRO RESULTS)         Result  Value  Ref Range            Color (UA POC)  Yellow         Clarity (UA POC)  Clear         Glucose (UA POC)  Negative  Negative       Bilirubin (UA POC)  1+  Negative       Ketones (UA POC)  Trace  Negative       Specific gravity (UA POC)  1.020  1.001 - 1.035       Blood (UA POC)  Trace  Negative       pH (UA POC)  6.5  4.6 - 8.0       Protein (UA POC)  Negative  Negative       Urobilinogen (UA POC)  1 mg/dL  0.2 - 1       Nitrites (UA POC)  Negative  Negative       Leukocyte esterase (UA POC)  Negative  Negative       Epithelial cells (UA POC)  0         WBCs (UA POC)  0          RBCs (UA POC)  0          Bacteria (UA POC)  None  Negative       Crystals (UA POC)  Negative  Negative            Other (UA POC)                Diagnoses:              ICD-10-CM  ICD-9-CM             1.  Benign prostatic hyperplasia with nocturia  N40.1  600.01   CYSTOURETHROSCOPY            R35.1  788.43  ECHO,TRANSRECTAL           AMB POC URINALYSIS DIP STICK AUTO W/ MICRO (MICRO RESULTS)           METABOLIC PANEL, BASIC           CBC W/O DIFF           CUSTOMER REQUEST  URINE CULTURE           CANCELED: CBC W/O DIFF           2.  Preop testing  Z01.818  V72.84  EKG, 12 LEAD, INITIAL     3.  Personal history of urinary calculi  Z87.442  V13.01             4.  Ureteropelvic junction (UPJ) obstruction  N13.5  593.4              SUMMARY OF VISIT AND PLAN:     Schedule for cysto, UroLift     F/u after      Patient's BMI is out of the normal parameters. Information about BMI was given and patient was advised to follow-up with their PCP for further management.      Medical documentation provided with the assistance of Isac Sarna, medical scribe for [employer], DO, [place].      Christi Marfa, DO

## 2018-05-26 NOTE — Progress Notes (Addendum)
Urolift Screen    05/26/18    Patient Name: Stanley Young          Universal Procedure Pause:    1. Correct patient confirmed:  yes    2. Allergies confirmed:  yes    3. Patient taking antiplatelet medications:  yes    4. Patient taking anticoagulants:  no    5. Correct procedure and side confirmed and consent signed:  yes    6. Correct antibiotics confirmed:  no        Pre-procedure Diagnosis:     ICD-10-CM ICD-9-CM    1. Benign prostatic hyperplasia with nocturia N40.1 600.01 CYSTOURETHROSCOPY    R35.1 788.43 ECHO,TRANSRECTAL      AMB POC URINALYSIS DIP STICK AUTO W/ MICRO (MICRO RESULTS)      METABOLIC PANEL, BASIC      CBC W/O DIFF      CUSTOMER REQUEST URINE CULTURE      CANCELED: CBC W/O DIFF   2. Preop testing Z01.818 V72.84 EKG, 12 LEAD, INITIAL   3. Personal history of urinary calculi Z87.442 V13.01    4. 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 Flomax caused dizziness and Rapaflo did not help. Hx of stones, on K citrate. Hx of firm prostate and high PTH - sees ENT. 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     Post-procedure Diagnosis: SAME    Consent:  All risks, benefits and options were reviewed in detail and the patient agrees to procedure. Risks include but are not limited to bleeding, infection, sepsis, death, dysuria and others.     Visit Vitals  BP (!) 140/94   Pulse 86   Temp 97.7 ??F (36.5 ??C)   Resp 18   Ht 6' (1.829 m)   Wt 219 lb 6.4 oz (99.5 kg)   BMI 29.76 kg/m??       Procedure:  The patient was placed in the supine position, and prepped and draped in the normal fashion. 5 ml of 2% Lidocaine gel was placed in the urethra. Once adequate anesthesia was achieved; the flexible cystoscope was placed into the urethra.     Findings as follows:      Meatus: normal  Urethra: normal   Prostate: moderate obstruction  Median lobe: no  Bladder neck: normal  Trigone: normal  Trabeculation:normal  Diverticuli: none  Lesion: none    Antibiotic provided:  Yes       Transrectal ultrasound prostate volume determination:  Patient placed in left lateral decubitus position with knees flexed to chest.  Ultrasound wand placed in rectum after lubricated. Prostate volume: 23.39g.  Hypoechoic nodules: no      Lab / Imaging:   Results for orders placed or performed in visit on 05/26/18   AMB POC URINALYSIS DIP STICK AUTO W/ MICRO (MICRO RESULTS)   Result Value Ref Range    Color (UA POC) Yellow     Clarity (UA POC) Clear     Glucose (UA POC) Negative Negative    Bilirubin (UA POC) 1+ Negative    Ketones (UA POC) Trace Negative    Specific gravity (UA POC) 1.020 1.001 - 1.035    Blood (UA POC) Trace Negative    pH (UA POC) 6.5 4.6 - 8.0    Protein (UA POC) Negative Negative  Urobilinogen (UA POC) 1 mg/dL 0.2 - 1    Nitrites (UA POC) Negative Negative    Leukocyte esterase (UA POC) Negative Negative    Epithelial cells (UA POC) 0     WBCs (UA POC) 0      RBCs (UA POC) 0      Bacteria (UA POC) None Negative    Crystals (UA POC) Negative Negative    Other (UA POC)          Diagnoses:     ICD-10-CM ICD-9-CM    1. Benign prostatic hyperplasia with nocturia N40.1 600.01 CYSTOURETHROSCOPY    R35.1 788.43 ECHO,TRANSRECTAL      AMB POC URINALYSIS DIP STICK AUTO W/ MICRO (MICRO RESULTS)      METABOLIC PANEL, BASIC      CBC W/O DIFF      CUSTOMER REQUEST URINE CULTURE      CANCELED: CBC W/O DIFF   2. Preop testing Z01.818 V72.84 EKG, 12 LEAD, INITIAL   3. Personal history of urinary calculi Z87.442 V13.01    4. 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 Schedule for cysto, UroLift  ?? F/u after    Patient's BMI is out of the normal parameters. Information about BMI was given and patient was advised to follow-up with their PCP for further management.     Medical documentation provided with the assistance of Lonell Grandchild, medical scribe for [employer], DO, [place].    Christi Byron, DO

## 2018-05-26 NOTE — Progress Notes (Signed)
HISTORY OF PRESENT ILLNESS:  Stanley Young is a 57 y.o. male who presents today for urolift screen for bph with nocturia 1-2, urgency, and post-void dribble.  Flomax caused dizziness and Rapaflo did not help. Firm prostate.  Hx stones on K citrate.   High PTH- seeing ENT.  Has mild right UPJO.  Found on screen today to have moderate bph and volume 23g.      AUA Symptom Score 05/26/2018   Over the past month how often have you had the sensation that your bladder was not completely empty after you finished urinating? 3   Over the past month, how often have had to urinate again less than 2 hours after you last finished urinating? 4   Over the past month, how often have you found you stopped and started again several times when you urinated? 3   Over the past month, how often have you found it difficult to postpone urination? 4   Over the past month, how often have you had a weak urinary stream? 4   Over the past month, how often have you had to push or strain to begin urinating? 3   Over the past month, how many times did you most typically get up to urinate from the time you went to bed at night until the time you got up in the morning? 3   AUA Score 24   If you were to spend the rest of your life with your urinary condition the way it is now, how would you feel about that? Mostly dissatisfied       Past Medical History:   Diagnosis Date   ??? Asthma    ??? BPH (benign prostatic hypertrophy)    ??? Heart abnormality    ??? Hypertension    ??? Kidney stone    ??? Prostate neoplasm    ??? 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 FRAGMENT KIDNEY STONE/ ESWL     ??? HX HEART CATHETERIZATION     ??? LASER VAPORIZATION SURGERY PROSTATE, COMPLETE         Social History     Tobacco Use   ??? Smoking status: Never Smoker   ??? Smokeless tobacco: Never Used   Substance Use Topics   ??? Alcohol use: No   ??? Drug use: No       Allergies   Allergen Reactions   ??? Codeine Unable to Obtain    ??? Hydrocodone Not Reported This Time   ??? Oxycodone Itching       Family History   Problem Relation Age of Onset   ??? Alzheimer Mother    ??? Cancer Father    ??? Heart Attack Father        Current Outpatient Medications   Medication Sig Dispense Refill   ??? NIFEdipine ER (PROCARDIA XL) 30 mg ER tablet   0   ??? silodosin (RAPAFLO) 8 mg capsule Take 1 Cap by mouth daily (with breakfast). 30 Cap 12   ??? atorvastatin (LIPITOR) 80 mg tablet Take 80 mg by mouth.     ??? aspirin 81 mg chewable tablet Take 81 mg by mouth.     ??? amLODIPine (NORVASC) 5 mg tablet   2   ??? nitroglycerin (NITROSTAT) 0.4 mg SL tablet 0.4 mg by SubLINGual route.     ??? potassium citrate (UROCIT-K10) 10 mEq (1,080 mg) TbER Take 1 Tab by mouth two (2) times  a day. 60 Tab 12         REVIEW OF SYSTEMS:  Constitutional: Fever: No  Skin: Rash: No  HEENT: Hearing difficulty: No  Eyes: Blurred vision: No  Cardiovascular: Chest pain: Yes  Respiratory: Shortness of breath: No  Gastrointestinal: Nausea/vomiting: No  Musculoskeletal: Back pain: No  Neurological: Weakness: No  Psychological: Memory loss: No  Comments/additional findings:       PHYSICAL EXAMINATION:   Visit Vitals  BP (!) 140/94   Pulse 86   Temp 97.7 ??F (36.5 ??C)   Resp 18   Ht 6' (1.829 m)   Wt 219 lb 6.4 oz (99.5 kg)   BMI 29.76 kg/m??     Constitutional: Well developed, no acute distress.   Eyes:  Conjunctiva normal.  Ears:  External ear normal.  Nose/Throat:  External nose normal.  CV:  Heart rate regular, No peripheral swelling noted.  Respiratory: No respiratory distress, No audible wheeze.  Skin: No rash, No ulcer.    Neuro/Psych:  Patient with appropriate affect.  Alert and oriented x 3.    Gait:  Normal.  GU Male:    SCROTUM: No scrotal rash or lesions. Normal bilateral testes and epididymides. No inguinal hernias noted.  PENIS: Urethral meatus normal in location and size. No urethral discharge.      REVIEW OF LABS AND IMAGING:      Results for orders placed or performed in visit on 05/26/18    AMB POC URINALYSIS DIP STICK AUTO W/ MICRO (MICRO RESULTS)   Result Value Ref Range    Color (UA POC) Yellow     Clarity (UA POC) Clear     Glucose (UA POC) Negative Negative    Bilirubin (UA POC) 1+ Negative    Ketones (UA POC) Trace Negative    Specific gravity (UA POC) 1.020 1.001 - 1.035    Blood (UA POC) Trace Negative    pH (UA POC) 6.5 4.6 - 8.0    Protein (UA POC) Negative Negative    Urobilinogen (UA POC) 1 mg/dL 0.2 - 1    Nitrites (UA POC) Negative Negative    Leukocyte esterase (UA POC) Negative Negative    Epithelial cells (UA POC) 0     WBCs (UA POC) 0      RBCs (UA POC) 0      Bacteria (UA POC) None Negative    Crystals (UA POC) Negative Negative    Other (UA POC)         ASSESSMENT:     ICD-10-CM ICD-9-CM    1. Benign prostatic hyperplasia with nocturia N40.1 600.01 CYSTOURETHROSCOPY    R35.1 788.43 ECHO,TRANSRECTAL      AMB POC URINALYSIS DIP STICK AUTO W/ MICRO (MICRO RESULTS)      METABOLIC PANEL, BASIC      CBC W/O DIFF      CUSTOMER REQUEST URINE CULTURE      CANCELED: CBC W/O DIFF   2. Preop testing Z01.818 V72.84 EKG, 12 LEAD, INITIAL   3. Personal history of urinary calculi Z87.442 V13.01    4. 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 Discussed option of Urolift vs turp- patient wants to have urolift- explained risks/benefits.  ?? KUB Jan 2020.  ?? psa March 2020.      Patient's BMI is out of the normal parameters. Information about BMI was given and patient was advised to follow-up with their PCP for further management.  Chief Complaint   Patient presents with   ??? Benign Prostatic Hypertrophy     Medical documentation provided with the assistance of Lonell Grandchild, medical scribe for [employer], [place], [place].    Anelise Staron [place], DO

## 2018-06-11 NOTE — Telephone Encounter (Signed)
Called pt to let him know that his surgery had to be moved to 9/23 @ 9am because we didn't receive cardio clearance for his cardiologst. I called two weeks ago to get his clearance and they said they would let Dr. Mathis Bud now and call back. I never heard anything so called Monday and they said that Dr. Mathis Bud was on vacation this week but they would send her a note and she may see it. I called back twice this week to check on the status and they said that she hadn't looked at it. Informed dr Reinaldo Raddle about everything and she said to rs for 9/23 and let pt know. Told pt that he needed to be @ the hospital that morning @ 7am. He verbalized understanding.

## 2018-06-18 DIAGNOSIS — I208 Other forms of angina pectoris: Secondary | ICD-10-CM | POA: Insufficient documentation

## 2018-06-18 DIAGNOSIS — I201 Angina pectoris with documented spasm: Secondary | ICD-10-CM | POA: Insufficient documentation

## 2018-06-18 NOTE — Telephone Encounter (Signed)
Called pt and let him know that his procedure on Monday 9/23 was moved up to 8:00am and that he had to be at the hospital @ 6am. Pt informed understanding.

## 2018-06-25 NOTE — Telephone Encounter (Signed)
Called Cline Crock to see how he is doing after surgery.    Patient is s/p Urolift done Sept 23,2019.     Patient describes the pain as no, none and rated as a 0. The patient has had no nausea and no vomiting. Patient has no fever or chills.     He understands what signs / symptoms to expect and understand the need to call the office with anything outside of those expected signs / symptoms.    Patient has a follow-up appointment: Yes July 13, 2018    Patient was told to call the office with any questions or concerns.

## 2018-07-06 ENCOUNTER — Encounter: Attending: Urology

## 2018-07-13 ENCOUNTER — Encounter: Attending: Urology

## 2018-07-13 NOTE — Progress Notes (Deleted)
HISTORY OF PRESENT ILLNESS:  Stanley Young is a 57 y.o. male who presents today in follow up for BPH s/p Urolift 06/21/18. Of note, Flomax caused dizziness and Rapaflo was ineffective. Hx stones, on K citrate. High PTH- seeing ENT.       AUA Symptom Score 05/26/2018   Over the past month how often have you had the sensation that your bladder was not completely empty after you finished urinating? 3   Over the past month, how often have had to urinate again less than 2 hours after you last finished urinating? 4   Over the past month, how often have you found you stopped and started again several times when you urinated? 3   Over the past month, how often have you found it difficult to postpone urination? 4   Over the past month, how often have you had a weak urinary stream? 4   Over the past month, how often have you had to push or strain to begin urinating? 3   Over the past month, how many times did you most typically get up to urinate from the time you went to bed at night until the time you got up in the morning? 3   AUA Score 24   If you were to spend the rest of your life with your urinary condition the way it is now, how would you feel about that? Mostly dissatisfied       Past Medical History:   Diagnosis Date   ??? Asthma    ??? BPH (benign prostatic hypertrophy)    ??? Heart abnormality    ??? Hypertension    ??? Kidney stone    ??? Prostate neoplasm    ??? 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 FRAGMENT KIDNEY STONE/ ESWL     ??? HX HEART CATHETERIZATION     ??? LASER VAPORIZATION SURGERY PROSTATE, COMPLETE         Social History     Tobacco Use   ??? Smoking status: Never Smoker   ??? Smokeless tobacco: Never Used   Substance Use Topics   ??? Alcohol use: No   ??? Drug use: No       Allergies   Allergen Reactions   ??? Codeine Unable to Obtain   ??? Hydrocodone Not Reported This Time   ??? Oxycodone Itching       Family History   Problem Relation Age of Onset   ??? Alzheimer Mother     ??? Cancer Father    ??? Heart Attack Father        Current Outpatient Medications   Medication Sig Dispense Refill   ??? NIFEdipine ER (PROCARDIA XL) 30 mg ER tablet   0   ??? silodosin (RAPAFLO) 8 mg capsule Take 1 Cap by mouth daily (with breakfast). 30 Cap 12   ??? atorvastatin (LIPITOR) 80 mg tablet Take 80 mg by mouth.     ??? aspirin 81 mg chewable tablet Take 81 mg by mouth.     ??? amLODIPine (NORVASC) 5 mg tablet   2   ??? nitroglycerin (NITROSTAT) 0.4 mg SL tablet 0.4 mg by SubLINGual route.     ??? potassium citrate (UROCIT-K10) 10 mEq (1,080 mg) TbER Take 1 Tab by mouth two (2) times a day. 60 Tab 12         REVIEW OF SYSTEMS:  Constitutional: Fever:   Skin: Rash:  HEENT: Hearing difficulty:   Eyes: Blurred vision:   Cardiovascular: Chest pain:   Respiratory: Shortness of breath:   Gastrointestinal: Nausea/vomiting:   Musculoskeletal: Back pain:   Neurological: Weakness:   Psychological: Memory loss:   Comments/additional findings:       PHYSICAL EXAMINATION:   There were no vitals taken for this visit.  Constitutional: Well developed, no acute distress.   Eyes:  Conjunctiva normal.  Ears:  External ear normal.  Nose/Throat:  External nose normal.  CV:  Heart rate regular, No peripheral swelling noted.  Respiratory: No respiratory distress, No audible wheeze.  Skin: No rash, No ulcer.    Neuro/Psych:  Patient with appropriate affect.  Alert and oriented x 3.    Gait:  Normal.  GU Male:    SCROTUM: No scrotal rash or lesions. Normal bilateral testes and epididymides. No inguinal hernias noted.  PENIS: Urethral meatus normal in location and size. No urethral discharge.      REVIEW OF LABS AND IMAGING:      Results for orders placed or performed in visit on 05/26/18   AMB POC URINALYSIS DIP STICK AUTO W/ MICRO (MICRO RESULTS)   Result Value Ref Range    Color (UA POC) Yellow     Clarity (UA POC) Clear     Glucose (UA POC) Negative Negative    Bilirubin (UA POC) 1+ Negative    Ketones (UA POC) Trace Negative     Specific gravity (UA POC) 1.020 1.001 - 1.035    Blood (UA POC) Trace Negative    pH (UA POC) 6.5 4.6 - 8.0    Protein (UA POC) Negative Negative    Urobilinogen (UA POC) 1 mg/dL 0.2 - 1    Nitrites (UA POC) Negative Negative    Leukocyte esterase (UA POC) Negative Negative    Epithelial cells (UA POC) 0     WBCs (UA POC) 0      RBCs (UA POC) 0      Bacteria (UA POC) None Negative    Crystals (UA POC) Negative Negative    Other (UA POC)         ASSESSMENT:     ICD-10-CM ICD-9-CM    1. Benign prostatic hyperplasia with nocturia N40.1 600.01     R35.1 788.43         PLAN:    ?? Discussed option of Urolift vs turp- patient wants to have urolift- explained risks/benefits.  ?? KUB Jan 2020.  ?? psa March 2020.      Patient's BMI is out of the normal parameters. Information about BMI was given and patient was advised to follow-up with their PCP for further management.    No chief complaint on file.    Christi Avis, DO, FACOS

## 2018-07-30 ENCOUNTER — Encounter: Attending: Urology

## 2018-07-30 NOTE — Progress Notes (Deleted)
HISTORY OF PRESENT ILLNESS:  Stanley Young is a 57 y.o. male who presents today for post-op f/u. S/p UroLift on 06/21/18.     Hx of firm prostate. Hx of high PTH - sees ENT. 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 Hx stones on K citrate.    Location: ***  Duration:  Years     Months   Weeks    Days  Associated symptoms: nausea  vomiting   fever   chills   weight loss   Severity: mild   moderate   severe  Quality: sharp   dull   ache  pressure  Timing:  At time of activity   When resting    Constant    Associated with food intake    AUA Symptom Score 05/26/2018   Over the past month how often have you had the sensation that your bladder was not completely empty after you finished urinating? 3   Over the past month, how often have had to urinate again less than 2 hours after you last finished urinating? 4   Over the past month, how often have you found you stopped and started again several times when you urinated? 3   Over the past month, how often have you found it difficult to postpone urination? 4   Over the past month, how often have you had a weak urinary stream? 4   Over the past month, how often have you had to push or strain to begin urinating? 3   Over the past month, how many times did you most typically get up to urinate from the time you went to bed at night until the time you got up in the morning? 3   AUA Score 24   If you were to spend the rest of your life with your urinary condition the way it is now, how would you feel about that? Mostly dissatisfied       Past Medical History:   Diagnosis Date   ??? Asthma    ??? BPH (benign prostatic hypertrophy)    ??? Heart abnormality    ??? Hypertension    ??? Kidney stone    ??? Prostate neoplasm    ??? 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 FRAGMENT KIDNEY STONE/ ESWL     ??? HX HEART CATHETERIZATION      ??? LASER VAPORIZATION SURGERY PROSTATE, COMPLETE         Social History     Tobacco Use   ??? Smoking status: Never Smoker   ??? Smokeless tobacco: Never Used   Substance Use Topics   ??? Alcohol use: No   ??? Drug use: No       Allergies   Allergen Reactions   ??? Codeine Unable to Obtain   ??? Hydrocodone Not Reported This Time   ??? Oxycodone Itching       Family History   Problem Relation Age of Onset   ??? Alzheimer Mother    ??? Cancer Father    ??? Heart Attack Father        Current Outpatient Medications   Medication Sig Dispense Refill   ??? NIFEdipine ER (PROCARDIA XL) 30 mg ER tablet   0   ??? silodosin (RAPAFLO) 8 mg capsule Take 1 Cap by mouth daily (with breakfast). 30 Cap 12   ???  atorvastatin (LIPITOR) 80 mg tablet Take 80 mg by mouth.     ??? aspirin 81 mg chewable tablet Take 81 mg by mouth.     ??? amLODIPine (NORVASC) 5 mg tablet   2   ??? nitroglycerin (NITROSTAT) 0.4 mg SL tablet 0.4 mg by SubLINGual route.     ??? potassium citrate (UROCIT-K10) 10 mEq (1,080 mg) TbER Take 1 Tab by mouth two (2) times a day. 60 Tab 12         REVIEW OF SYSTEMS:  Constitutional: Fever:   Skin: Rash:   HEENT: Hearing difficulty:   Eyes: Blurred vision:   Cardiovascular: Chest pain:   Respiratory: Shortness of breath:   Gastrointestinal: Nausea/vomiting:   Musculoskeletal: Back pain:   Neurological: Weakness:   Psychological: Memory loss:   Comments/additional findings:       PHYSICAL EXAMINATION:   There were no vitals taken for this visit.  Constitutional: Well developed, no acute distress.   Eyes:  Conjunctiva normal.  Ears:  External ear normal.  Nose/Throat:  External nose normal.  CV:  Heart rate regular, No peripheral swelling noted.  Respiratory: No respiratory distress, No audible wheeze.  Abdomen:  Soft, nontender, nondistended, no mass/organomegaly.  Vascular:  Abdominal Aorta nonpalpable.  Skin: No rash, No ulcer.    Neuro/Psych:  Patient with appropriate affect.  Alert and oriented x 3.    Gait:  Normal.  GU Male:  ***    CVA: non-tender bilaterally.  Bladder not palpable.  MVH:QIONGEXB normal to visual inspection. Sphincter with good tone, Rectum with no masses.  Prostate is {Small, med, mod, large:15557}  SCROTUM:  No scrotal rash or lesions.  Normal bilateral testes and epididymides. No inguinal hernias noted.  PENIS: Urethral meatus normal in location and size. No urethral discharge.      REVIEW OF LABS AND IMAGING:      Results for orders placed or performed in visit on 05/26/18   AMB POC URINALYSIS DIP STICK AUTO W/ MICRO (MICRO RESULTS)   Result Value Ref Range    Color (UA POC) Yellow     Clarity (UA POC) Clear     Glucose (UA POC) Negative Negative    Bilirubin (UA POC) 1+ Negative    Ketones (UA POC) Trace Negative    Specific gravity (UA POC) 1.020 1.001 - 1.035    Blood (UA POC) Trace Negative    pH (UA POC) 6.5 4.6 - 8.0    Protein (UA POC) Negative Negative    Urobilinogen (UA POC) 1 mg/dL 0.2 - 1    Nitrites (UA POC) Negative Negative    Leukocyte esterase (UA POC) Negative Negative    Epithelial cells (UA POC) 0     WBCs (UA POC) 0      RBCs (UA POC) 0      Bacteria (UA POC) None Negative    Crystals (UA POC) Negative Negative    Other (UA POC)         ASSESSMENT:   {No Diagnosis Found}           PLAN:    ?? ***         No chief complaint on file.        A copy of today's office visit was sent to the referring physician.      Autum Benfer Blanchie Serve

## 2018-08-24 DIAGNOSIS — M25519 Pain in unspecified shoulder: Secondary | ICD-10-CM | POA: Insufficient documentation

## 2018-08-24 DIAGNOSIS — S46019A Strain of muscle(s) and tendon(s) of the rotator cuff of unspecified shoulder, initial encounter: Secondary | ICD-10-CM | POA: Insufficient documentation

## 2019-08-26 DIAGNOSIS — G8929 Other chronic pain: Secondary | ICD-10-CM | POA: Insufficient documentation

## 2019-12-13 ENCOUNTER — Other Ambulatory Visit: Payer: Self-pay

## 2019-12-13 DIAGNOSIS — N399 Disorder of urinary system, unspecified: Secondary | ICD-10-CM

## 2019-12-15 NOTE — Progress Notes (Incomplete)
   12/16/19 9:15 PM   Warden Fillers 12-30-1960 [id number]  Referring provider: No referring provider defined for this encounter.  No chief complaint on file.   HPI: Noel Thormahlen is a 59 y.o. M who presents today to transfer care from [facility] and for the evaluation and management of enlarged prostate s/p Urolift.   He had Urolift one year ago and still experiences urinary symptoms.   His PSA 0.46 as of 08/23/19. His creatinine 287.98 as of 08/23/19.     1. ***  *** 2. *** *** 3. *** ***     PMH: No past medical history on file.  Surgical History: *** The histories are not reviewed yet. Please review them in the "History" navigator section and refresh this [place].  Home Medications:  Allergies as of 12/16/2019   Not on File     Medication List    as of December 15, 2019  9:15 PM   You have not been prescribed any medications.     Allergies: Not on File  Family History: No family history on file.  Social History:  has no history on file for tobacco, alcohol, and drug.   Physical Exam: There were no vitals taken for this visit.  Constitutional:  Alert and oriented, No acute distress. HEENT: [place] AT, moist mucus membranes.  Trachea midline, no masses. Cardiovascular: No clubbing, cyanosis, or edema. Respiratory: Normal respiratory effort, no increased work of breathing. GI: Abdomen is soft, nontender, nondistended, no abdominal masses GU: No CVA tenderness Lymph: No cervical or inguinal lymphadenopathy. Skin: No rashes, bruises or suspicious lesions. Neurologic: Grossly intact, no focal deficits, moving all 4 extremities. Psychiatric: Normal mood and affect.  Laboratory Data:  Urinalysis  Pertinent Imaging: ***   Assessment & Plan:    @DIAGMED @  No follow-ups on file.  [facility] [address], [place], [place] [address] [phone]  I, Lucas Mallow, am  acting as a scribe for Dr. Hollice Espy,  {Add Scribe Attestation Statement}

## 2019-12-16 ENCOUNTER — Ambulatory Visit: Payer: Self-pay | Admitting: Urology

## 2019-12-16 DIAGNOSIS — F419 Anxiety disorder, unspecified: Secondary | ICD-10-CM | POA: Insufficient documentation

## 2019-12-19 DIAGNOSIS — R1013 Epigastric pain: Secondary | ICD-10-CM | POA: Insufficient documentation

## 2020-01-17 HISTORY — PX: ESOPHAGOGASTRODUODENOSCOPY: SHX1529

## 2020-01-17 HISTORY — PX: COLONOSCOPY: SHX174

## 2020-01-23 DIAGNOSIS — Z87442 Personal history of urinary calculi: Secondary | ICD-10-CM | POA: Insufficient documentation

## 2020-01-24 ENCOUNTER — Other Ambulatory Visit: Payer: Self-pay

## 2020-01-27 ENCOUNTER — Ambulatory Visit: Payer: Self-pay | Admitting: Urology

## 2020-02-10 HISTORY — PX: EXPLORATORY LAPAROTOMY W/ BOWEL RESECTION: SHX1544

## 2020-08-20 HISTORY — PX: TEMPORAL ARTERY BIOPSY / LIGATION: SUR132

## 2020-09-19 ENCOUNTER — Encounter: Payer: Self-pay | Admitting: Gastroenterology

## 2020-09-19 ENCOUNTER — Ambulatory Visit: Payer: BLUE CROSS/BLUE SHIELD | Admitting: Gastroenterology

## 2020-09-19 VITALS — BP 124/83 | HR 106 | Ht 72.0 in | Wt 236.2 lb

## 2020-09-19 DIAGNOSIS — K529 Noninfective gastroenteritis and colitis, unspecified: Secondary | ICD-10-CM

## 2020-09-19 DIAGNOSIS — R10811 Right upper quadrant abdominal tenderness: Secondary | ICD-10-CM | POA: Diagnosis not present

## 2020-09-19 NOTE — Patient Instructions (Signed)
Low-FODMAP Eating Plan  FODMAPs (fermentable oligosaccharides, disaccharides, monosaccharides, and polyols) are sugars that are hard for some people to digest. A low-FODMAP eating plan may help some people who have bowel (intestinal) diseases to manage their symptoms. This meal plan can be complicated to follow. Work with a diet and nutrition specialist (dietitian) to make a low-FODMAP eating plan that is right for you. A dietitian can make sure that you get enough nutrition from this diet. What are tips for following this plan? Reading food labels  Check labels for hidden FODMAPs such as: ? High-fructose syrup. ? Honey. ? Agave. ? Natural fruit flavors. ? Onion or garlic powder.  Choose low-FODMAP foods that contain 3-4 grams of fiber per serving.  Check food labels for serving sizes. Eat only one serving at a time to make sure FODMAP levels stay low. Meal planning  Follow a low-FODMAP eating plan for up to 6 weeks, or as told by your health care provider or dietitian.  To follow the eating plan: 1. Eliminate high-FODMAP foods from your diet completely. 2. Gradually reintroduce high-FODMAP foods into your diet one at a time. Most people should wait a few days after introducing one high-FODMAP food before they introduce the next high-FODMAP food. Your dietitian can recommend how quickly you may reintroduce foods. 3. Keep a daily record of what you eat and drink, and make note of any symptoms that you have after eating. 4. Review your daily record with a dietitian regularly. Your dietitian can help you identify which foods you can eat and which foods you should avoid. General tips  Drink enough fluid each day to keep your urine pale yellow.  Avoid processed foods. These often have added sugar and may be high in FODMAPs.  Avoid most dairy products, whole grains, and sweeteners.  Work with a dietitian to make sure you get enough fiber in your diet. Recommended  foods Grains  Gluten-free grains, such as rice, oats, buckwheat, quinoa, corn, polenta, and millet. Gluten-free pasta, bread, or cereal. Rice noodles. Corn tortillas. Vegetables  Eggplant, zucchini, cucumber, peppers, green beans, Brussels sprouts, bean sprouts, lettuce, arugula, kale, Swiss chard, spinach, collard greens, bok choy, summer squash, potato, and tomato. Limited amounts of corn, carrot, and sweet potato. Green parts of scallions. Fruits  Bananas, oranges, lemons, limes, blueberries, raspberries, strawberries, grapes, cantaloupe, honeydew melon, kiwi, papaya, passion fruit, and pineapple. Limited amounts of dried cranberries, banana chips, and shredded coconut. Dairy  Lactose-free milk, yogurt, and kefir. Lactose-free cottage cheese and ice cream. Non-dairy milks, such as almond, coconut, hemp, and rice milk. Yogurts made of non-dairy milks. Limited amounts of goat cheese, brie, mozzarella, parmesan, swiss, and other hard cheeses. Meats and other protein foods  Unseasoned beef, pork, poultry, or fish. Eggs. Bacon. Tofu (firm) and tempeh. Limited amounts of nuts and seeds, such as almonds, walnuts, brazil nuts, pecans, peanuts, pumpkin seeds, chia seeds, and sunflower seeds. Fats and oils  Butter-free spreads. Vegetable oils, such as olive, canola, and sunflower oil. Seasoning and other foods  Artificial sweeteners with names that do not end in "ol" such as aspartame, saccharine, and stevia. Maple syrup, white table sugar, raw sugar, brown sugar, and molasses. Fresh basil, coriander, parsley, rosemary, and thyme. Beverages  Water and mineral water. Sugar-sweetened soft drinks. Small amounts of orange juice or cranberry juice. Black and green tea. Most dry wines. Coffee. This may not be a complete list of low-FODMAP foods. Talk with your dietitian for more information. Foods to avoid Grains  Wheat,   including kamut, durum, and semolina. Barley and bulgur. Couscous. Wheat-based  cereals. Wheat noodles, bread, crackers, and pastries. Vegetables  Chicory root, artichoke, asparagus, cabbage, snow peas, sugar snap peas, mushrooms, and cauliflower. Onions, garlic, leeks, and the white part of scallions. Fruits  Fresh, dried, and juiced forms of apple, pear, watermelon, peach, plum, cherries, apricots, blackberries, boysenberries, figs, nectarines, and mango. Avocado. Dairy  Milk, yogurt, ice cream, and soft cheese. Cream and sour cream. Milk-based sauces. Custard. Meats and other protein foods  Fried or fatty meat. Sausage. Cashews and pistachios. Soybeans, baked beans, black beans, chickpeas, kidney beans, fava beans, navy beans, lentils, and split peas. Seasoning and other foods  Any sugar-free gum or candy. Foods that contain artificial sweeteners such as sorbitol, mannitol, isomalt, or xylitol. Foods that contain honey, high-fructose corn syrup, or agave. Bouillon, vegetable stock, beef stock, and chicken stock. Garlic and onion powder. Condiments made with onion, such as hummus, chutney, pickles, relish, salad dressing, and salsa. Tomato paste. Beverages  Chicory-based drinks. Coffee substitutes. Chamomile tea. Fennel tea. Sweet or fortified wines such as port or sherry. Diet soft drinks made with isomalt, mannitol, maltitol, sorbitol, or xylitol. Apple, pear, and mango juice. Juices with high-fructose corn syrup. This may not be a complete list of high-FODMAP foods. Talk with your dietitian to discuss what dietary choices are best for you.  Summary  A low-FODMAP eating plan is a short-term diet that eliminates FODMAPs from your diet to help ease symptoms of certain bowel diseases.  The eating plan usually lasts up to 6 weeks. After that, high-FODMAP foods are restarted gradually, one at a time, so you can find out which may be causing symptoms.  A low-FODMAP eating plan can be complicated. It is best to work with a dietitian who has experience with this type of  plan. This information is not intended to replace advice given to you by your health care provider. Make sure you discuss any questions you have with your health care provider. Document Revised: 08/28/2017 Document Reviewed: 05/12/2017 Elsevier Patient Education  2020 Elsevier Inc.  

## 2020-09-19 NOTE — Progress Notes (Signed)
Jonathon Bellows MD, MRCP(U.K) [address]  [place]  [place], [place] [address]  Main: [phone]  Fax: [phone]   Gastroenterology Consultation  Referring Provider:     Vaughan Basta,* Primary Care Physician:  No primary care provider on file. Primary Gastroenterologist:  Dr. Jonathon Bellows  Reason for Consultation:     Diarrhea        HPI:   Jack Shaw is a 59 y.o. y/o male referred for evaluation of diarrhea.  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  At his office visit with his surgeon in July 2021 was having up to 15 bowel movements a day.  He is here to see me for a second opinion.  He has had a lot of bloating.  On epic I do not see any gastroenterology evaluation.  Labs 1.  July 2021 stool culture was negative.  CRP normal in November 2021, hemoglobin 15.6 g.  April 2021 underwent EGD and colonoscopy at [facility] for prior history of colon polyps and epigastric discomfort.  Endoscopy note mentions tortuous colon no specimens collected diverticulosis in the rectum.  Suggested to have a repeat colonoscopy in 5 years.  EGD demonstrated normal esophagus small hiatal hernia, LA grade a esophagitis.  Was given a course of Lomotil but developed itching and had to stop it.  He states that he was having 15 bowel movements a day watery nonbloody associated with a lot of rumbling and gurgling sounds.  A lot of flatulence and gaseous distention of his belly.  He was given a course of steroids for his eye and significantly improved his symptoms but still having multiple bowel movements a day.  Denies any weight loss.  He consumes artificial sugars and sweet tea and sodas on a daily basis.  Denies any chewing gum.  Not taking any over-the-counter medications.  He complains of swelling and a bulge on the right side of his abdomen.  No past medical history on file.    Prior to  Admission medications   Not on File    No family history on file.   Social History   Tobacco Use  . Smoking status: Never Smoker  . Smokeless tobacco: Never Used    Allergies as of 09/19/2020 - Review Complete 09/19/2020  Allergen Reaction Noted  . Codeine Itching 06/27/2016  . Hydrocodone Itching 08/30/2015  . Oxycodone Itching 08/30/2015    Review of Systems:    All systems reviewed and negative except where noted in HPI.   Physical Exam:  BP 124/83 (BP Location: Left Arm, Patient Position: Sitting, Cuff Size: Large)   Pulse (!) 106   Ht 6' (1.829 m)   Wt 236 lb 3.2 oz (107.1 kg)   BMI 32.03 kg/m  No LMP for male patient. Psych:  Alert and cooperative. Normal mood and affect. General:   Alert,  Well-developed, well-nourished, pleasant and cooperative in NAD Head:  Normocephalic and atraumatic. Eyes:  Sclera clear, no icterus.   Conjunctiva pink. Ears:  Normal auditory acuity. Lungs:  Respirations even and unlabored.  Clear throughout to auscultation.   No wheezes, crackles, or rhonchi. No acute distress. Heart:  Regular rate and rhythm; no murmurs, clicks, rubs, or gallops. Abdomen: Central midline abdominal scar.  Bulging-like appearance in the right upper quadrant with mild tenderness normal bowel sounds.  No bruits.  Soft, non-tenderwithout masses, hepatosplenomegaly or hernias noted.  No guarding or rebound tenderness.  Neurologic:  Alert and oriented x3;  grossly normal neurologically. Psych:  Alert and cooperative. Normal mood and affect.  Imaging Studies: No results found.  Assessment and Plan:   Jack Shaw is a 59 y.o. y/o male has been referred for significant diarrhea since undergoing abdominal surgery where he was treated for a diaphragmatic rupture and had a part of the small bowel resected.  His history is very suggestive of IBS diarrhea or may have microscopic colitis.  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  Unfortunately to diagnose microscopic colitis he will need to have a repeat colonoscopy.  Hence I will treat him for pancreatic insufficiency and IBS diarrhea first to see if he feels any better over the the next 4 weeks.  If no better will need to proceed with colonoscopy with intubation of the terminal ileum and take multiple biopsies throughout.  Plan 1.  Check stool for fecal calprotectin to rule out inflammation which would suggest a secretory diarrhea.  I would also check him for GI PCR and C. Difficile.  2.  Low FODMAP diet, celiac serology, TSH.  3.  Commence him on Creon 70,000 units prior to every meal and 35,000 units before every snack.  I will give him a sample pack for 2 weeks.   4.  No NSAIDs  5.  Right upper quadrant bulge-like sensation with mild tenderness obtain ultrasound  Follow up in 2 to 4 weeks telephone visit  Dr Jonathon Bellows MD,MRCP(U.K)

## 2020-09-21 LAB — CELIAC DISEASE AB SCREEN W/RFX
Antigliadin Abs, IgA: 1 units (ref 0–19)
IgA/Immunoglobulin A, Serum: 89 mg/dL (ref 61–437)
Transglutaminase IgA: <2 U/mL (ref 0–3)

## 2020-09-21 LAB — TSH: TSH: 1.55 u[IU]/mL (ref 0.450–4.500)

## 2020-09-21 LAB — B12 AND FOLATE PANEL
Folate: 16.9 ng/mL (ref >3.0)
Vitamin B-12: 178 pg/mL — ABNORMAL LOW (ref 232–1245)

## 2020-09-24 ENCOUNTER — Telehealth: Payer: Self-pay

## 2020-09-24 NOTE — Telephone Encounter (Signed)
Pt has been notified of results and Dr. Johnney Killian recommendations. Pt agrees to contact his pcp.

## 2020-09-24 NOTE — Telephone Encounter (Signed)
-----   Message from Wyline Mood, MD sent at 09/20/2020  9:42 AM EST ----- 1.  Informed his B12 level is very low needs to start on B12 injections with his primary care physician or the doctor who referred him to see [place].  It is also possible that his low B12 level is affecting his eye condition.  I would suggest him to discuss with his ophthalmologist and inform the ophthalmologist that his B12 levels are very low and to find out if there is any connection with the same.

## 2020-09-26 ENCOUNTER — Other Ambulatory Visit: Payer: Self-pay | Admitting: Gastroenterology

## 2020-09-26 ENCOUNTER — Other Ambulatory Visit: Payer: Self-pay

## 2020-09-26 ENCOUNTER — Encounter: Payer: Self-pay | Admitting: Gastroenterology

## 2020-09-26 ENCOUNTER — Ambulatory Visit
Admission: RE | Admit: 2020-09-26 | Discharge: 2020-09-26 | Disposition: A | Discharge status: Home / Self Care | Payer: BLUE CROSS/BLUE SHIELD | Source: Ambulatory Visit | Attending: Gastroenterology | Admitting: Gastroenterology

## 2020-09-26 DIAGNOSIS — R10811 Right upper quadrant abdominal tenderness: Secondary | ICD-10-CM | POA: Insufficient documentation

## 2020-10-01 DIAGNOSIS — R10811 Right upper quadrant abdominal tenderness: Secondary | ICD-10-CM

## 2020-10-01 NOTE — Telephone Encounter (Signed)
Previously oibtrained ultrasound which was normal , if pain persists suggest HIDA scan to check function of gall bladder

## 2020-10-01 NOTE — Telephone Encounter (Signed)
Got patient scan scheduled for 10/09/2020 arrived  [facility] 9:30[facility] for a 10:00 scan. Nothing to eat or drink after midnight Patient verbalized understanding of instructions

## 2020-10-02 ENCOUNTER — Encounter: Payer: Self-pay | Admitting: Gastroenterology

## 2020-10-02 LAB — CALPROTECTIN, FECAL: Calprotectin, Fecal: 81 ug/g (ref 0–120)

## 2020-10-08 ENCOUNTER — Telehealth: Payer: Self-pay

## 2020-10-08 NOTE — Telephone Encounter (Signed)
Informed patient his scan has been rescheduled for tomorrow. He is to have NPO 6 hours prior to scan. Pt verbalized understanding.

## 2020-10-09 ENCOUNTER — Ambulatory Visit: Payer: BLUE CROSS/BLUE SHIELD

## 2020-10-09 ENCOUNTER — Telehealth (INDEPENDENT_AMBULATORY_CARE_PROVIDER_SITE_OTHER): Payer: BLUE CROSS/BLUE SHIELD | Admitting: Gastroenterology

## 2020-10-09 ENCOUNTER — Telehealth: Payer: Self-pay

## 2020-10-09 ENCOUNTER — Ambulatory Visit
Admission: RE | Admit: 2020-10-09 | Discharge: 2020-10-09 | Disposition: A | Discharge status: Home / Self Care | Payer: BLUE CROSS/BLUE SHIELD | Source: Ambulatory Visit | Attending: Gastroenterology | Admitting: Gastroenterology

## 2020-10-09 ENCOUNTER — Other Ambulatory Visit: Payer: Self-pay

## 2020-10-09 DIAGNOSIS — R10811 Right upper quadrant abdominal tenderness: Secondary | ICD-10-CM | POA: Insufficient documentation

## 2020-10-09 DIAGNOSIS — K529 Noninfective gastroenteritis and colitis, unspecified: Secondary | ICD-10-CM | POA: Diagnosis not present

## 2020-10-09 MED ORDER — TECHNETIUM TC 99M MEBROFENIN IV KIT
5.0000 | PACK | Freq: Once | INTRAVENOUS | Status: AC | PRN
  Administered 2020-10-09: 5.15 via INTRAVENOUS

## 2020-10-09 NOTE — Progress Notes (Signed)
Jack Shaw , MD [address]  [place]  [place], [place] [address]  Main: [phone]  Fax: [phone]   Primary Care Physician: Default, Provider, MD  Virtual Visit via Telephone Note  I connected with patient on 10/09/20 at  1:15 PM EST by telephone and verified that I am speaking with the correct person using two identifiers.   I discussed the limitations, risks, security and privacy concerns of performing an evaluation and management service by telephone and the availability of in person appointments. I also discussed with the patient that there may be a patient responsible charge related to this service. The patient expressed understanding and agreed to proceed.  Location of Patient: Home Location of Provider: Home Persons involved: Patient and provider only   History of Present Illness:  Diarrhea follow up   HPI: Jack Shaw is a 60 y.o. male   Initially referred and seen on 09/19/2020 for diarrhea . 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  At his office visit with his surgeon in July 2021 was having up to 15 bowel movements a day.  He is here to see me for a second opinion.  He has had a lot of bloating. He states that he was having 15 bowel movements a day watery nonbloody associated with a lot of rumbling and gurgling sounds.  A lot of flatulence and gaseous distention of his belly.  He was given a course of steroids for his eye and significantly improved his symptoms but still having multiple bowel movements a day.  Denies any weight loss.  He consumes artificial sugars and sweet tea and sodas on a daily basis.  Denies any chewing gum.  Not taking any over-the-counter medications.  He complains of swelling and a bulge on the right side of his abdomen.  July 2021 stool culture was negative.  CRP normal in November 2021, hemoglobin 15.6 g.  April 2021 underwent EGD and  colonoscopy at [facility] for prior history of colon polyps and epigastric discomfort.  Endoscopy note mentions tortuous colon no specimens collected diverticulosis in the rectum.  Suggested to have a repeat colonoscopy in 5 years.  EGD demonstrated normal esophagus small hiatal hernia, LA grade a esophagitis.  Was given a course of Lomotil but developed itching and had to stop it.     Interval history   09/19/2020-10/09/2020  09/26/2020: RUQ usg : Hepatic steatosis  09/26/2020: fecal calprotectin, folate , celiac serology, TSH normal . Low B12 at 178 He just had his HIDA scan today.  He had severe abdominal pain after the HIDA scan.  He continues to have loose stools.  He states that the Creon did not help him significantly.  He has been restarted on steroids by his eye doctor for his eye condition.  Last colonoscopy was over a year back.  Feels a bit better after starting on steroids.  Current Outpatient Medications  Medication Sig Dispense Refill  . aspirin 81 MG EC tablet Take by mouth.    Marland Kitchen atorvastatin (LIPITOR) 20 MG tablet Take by mouth.    Marland Kitchen atorvastatin (LIPITOR) 80 MG tablet Take 80 mg by mouth daily. (Patient not taking: Reported on 09/19/2020)    . cephALEXin (KEFLEX) 500 MG capsule Take by mouth. (Patient not taking: Reported on 09/19/2020)    . cyclobenzaprine (FLEXERIL) 10 MG tablet Take by mouth. (Patient not taking: Reported on 09/19/2020)    . diazepam (  VALIUM) 5 MG tablet SMARTSIG:1 Tablet(s) By Mouth (Patient not taking: Reported on 09/19/2020)    . diphenoxylate-atropine (LOMOTIL) 2.5-0.025 MG tablet Take 1 tablet by mouth every 8 (eight) hours as needed. (Patient not taking: Reported on 09/19/2020)    . erythromycin ophthalmic ointment 2 (two) times daily. (Patient not taking: Reported on 09/19/2020)    . gabapentin (NEURONTIN) 100 MG capsule Take by mouth. (Patient not taking: Reported on 09/19/2020)    . hydrOXYzine (ATARAX/VISTARIL) 25 MG tablet  (Patient not  taking: Reported on 09/19/2020)    . ibuprofen (ADVIL) 400 MG tablet Take by mouth. (Patient not taking: Reported on 09/19/2020)     No current facility-administered medications for this visit.    Allergies as of 10/09/2020 - Review Complete 09/19/2020  Allergen Reaction Noted  . Codeine Itching 06/27/2016  . Hydrocodone Itching 08/30/2015  . Oxycodone Itching 08/30/2015    Review of Systems:    All systems reviewed and negative except where noted in HPI.   Observations/Objective:  Labs: CMP  No results found for: NA, K, CL, CO2, GLUCOSE, BUN, CREATININE, CALCIUM, PROT, ALBUMIN, AST, ALT, ALKPHOS, BILITOT, GFRNONAA, GFRAA No results found for: WBC, HGB, HCT, MCV, PLT  Imaging Studies: US Abdomen Limited RUQ (LIVER/GB)  Result Date: 09/26/2020 CLINICAL DATA:  Right upper quadrant pain EXAM: ULTRASOUND ABDOMEN LIMITED RIGHT UPPER QUADRANT COMPARISON:  03/30/2019 FINDINGS: Gallbladder: No gallstones or wall thickening visualized. No sonographic Murphy sign noted by sonographer. Common bile duct: Diameter: 4 mm Liver: Heterogeneous increased echogenicity compatible with Hepatic steatosis. Hypoechoic area along the gallbladder fossa favored to be fatty sparing. No biliary dilatation. No large focal hepatic abnormality. Portal vein is patent on color Doppler imaging with normal direction of blood flow towards the liver. Other: No free fluid or ascites. Included views of the right kidney demonstrate no hydronephrosis. IMPRESSION: Negative for gallstones or biliary dilatation. Hepatic steatosis as above. No free fluid or acute finding by ultrasound. Electronically Signed   By: Jerilynn Mages.  Shick M.D.   On: 09/26/2020 13:31    Assessment and Plan:   Jack Shaw is a 60 y.o. y/o male here to follow up for  diarrhea since undergoing abdominal surgery where he was treated for a diaphragmatic rupture and had a part of the small bowel resected.  His history is very suggestive of IBS diarrhea or may have  microscopic colitis.  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  Since he is no better after starting on Creon I will proceed with colonoscopy to take biopsies of the colon to rule out microscopic colitis and biopsies of the terminal ileum to rule out Crohn's disease after ruling out any GI tract infection with the GI PCR stool test as well as ruling out C. difficile diarrhea.  He has started replacement of his B12.  I have discussed alternative options, risks & benefits,  which include, but are not limited to, bleeding, infection, perforation,respiratory complication & drug reaction.  The patient agrees with this plan & written consent will be obtained.    Follow-up in 4 weeks     I discussed the assessment and treatment plan with the patient. The patient was provided an opportunity to ask questions and all were answered. The patient agreed with the plan and demonstrated an understanding of the instructions.   The patient was advised to call back or seek an in-person evaluation if the symptoms worsen or if  the condition fails to improve as anticipated.  I provided 15 minutes of non-face-to-face time during this encounter.  Dr Jack Bellows MD,MRCP [facility]) Gastroenterology/Hepatology Pager: [phone]   Speech recognition software was used to dictate this note.

## 2020-10-09 NOTE — Telephone Encounter (Signed)
Called patient to inform him Dr. Vicente Males would like for him to have labs done. Explained to patient he can go to any [facility] location to have labs performed. Once results are back we will schedule his colonoscopy. Pt verbalized understanding.

## 2020-10-10 ENCOUNTER — Telehealth: Payer: Self-pay | Admitting: Gastroenterology

## 2020-10-10 NOTE — Telephone Encounter (Signed)
Patient was scheduled for 6W follow up on 2.23.22 @11 :15am. FYI

## 2020-10-10 NOTE — Telephone Encounter (Signed)
-----   Message from Storm Frisk, [place] sent at 10/09/2020  1:55 PM EST ----- Regarding: Office visit Can you schedule him for an office visit in 6 weeks per Vicente Males?    Thanks

## 2020-10-11 NOTE — Telephone Encounter (Signed)
Made patient a appointment

## 2020-10-11 NOTE — Telephone Encounter (Signed)
Let me see him next Tuesday at 11.15 am to discuss recent imaging and persisting issues and come up with a plan   Dr Jonathon Bellows MD,MRCP [facility]) Gastroenterology/Hepatology Pager: [phone]

## 2020-10-11 NOTE — Telephone Encounter (Signed)
Jan 25th 1 pm I can see him

## 2020-10-16 ENCOUNTER — Telehealth: Payer: Self-pay

## 2020-10-16 NOTE — Telephone Encounter (Signed)
Patient has appointment on 10/23/2020

## 2020-10-16 NOTE — Telephone Encounter (Signed)
-----   Message from Jonathon Bellows, MD sent at 10/15/2020 10:40 AM EST ----- Please inform that the results of the HIDA scan were indeterminant and I would like to discuss the options with him at his follow-up visit.  Dr Jonathon Bellows MD,MRCP [facility]) Gastroenterology/Hepatology Pager: [phone]

## 2020-10-23 ENCOUNTER — Ambulatory Visit: Payer: BLUE CROSS/BLUE SHIELD | Admitting: Gastroenterology

## 2020-11-21 ENCOUNTER — Ambulatory Visit: Payer: BLUE CROSS/BLUE SHIELD | Admitting: Gastroenterology

## 2020-11-21 ENCOUNTER — Other Ambulatory Visit: Payer: Self-pay

## 2020-11-21 VITALS — BP 137/96 | HR 97 | Temp 98.1°F | Ht 72.0 in | Wt 245.0 lb

## 2020-11-21 DIAGNOSIS — R1011 Right upper quadrant pain: Secondary | ICD-10-CM | POA: Diagnosis not present

## 2020-11-21 DIAGNOSIS — K9049 Malabsorption due to intolerance, not elsewhere classified: Secondary | ICD-10-CM

## 2020-11-21 DIAGNOSIS — K529 Noninfective gastroenteritis and colitis, unspecified: Secondary | ICD-10-CM | POA: Diagnosis not present

## 2020-11-21 DIAGNOSIS — R948 Abnormal results of function studies of other organs and systems: Secondary | ICD-10-CM

## 2020-11-21 NOTE — Progress Notes (Signed)
Jonathon Bellows MD, MRCP(U.K) [address]  [place]  [place], [place] [address]  Main: [phone]  Fax: [phone]   Primary Care Physician: Patient, No Pcp Per  Primary Gastroenterologist:  Dr. Jonathon Bellows   Chief Complaint  Patient presents with  . Diarrhea  . Abdominal Pain    HPI: Deston Bilyeu is a 60 y.o. male    Summary of history :  Initially referred and seen on 09/19/2020 for diarrhea . 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 At his office visit with his surgeon in July 2021 was having up to 15 bowel movements a day. He saw me for a second opinion. He has had a lot of bloating,  15 bowel movements a day watery nonbloody associated with a lot of rumbling and gurgling sounds. A lot of flatulence and gaseous distention of his belly. He was given a course of steroids for his eye and significantly improved his symptoms but still having multiple bowel movements a day. Denied any weight loss. He consumes artificial sugars and sweet tea and sodas on a daily basis. Denies any chewing gum. Not taking any over-the-counter medications. He complains of swelling and a bulge on the right side of his abdomen.  July 2021 stool culture was negative. CRP normal in November 2021, hemoglobin 15.6 g.  April 2021 underwent EGD and colonoscopy at [facility] for prior history of colon polyps and epigastric discomfort. Endoscopy note mentions tortuous colon no specimens collected diverticulosis in the rectum. Suggested to have a repeat colonoscopy in 5 years. EGD demonstrated normal esophagus small hiatal hernia, LA grade a esophagitis. Was given a course of Lomotil but developed itching and had to stop it.  09/26/2020: RUQ usg : Hepatic steatosis  09/26/2020: fecal calprotectin, folate , celiac serology, TSH normal . Low B12 at 178 Bile of Creon for extrapancreatic insufficiency  did not help  Interval history   10/09/2020-11/21/2020  10/09/2020 HIDA scan: Ejection fraction of 82% he developed right upper quadrant pain after drinking Ensure.  He states that every time he eats a fatty meal such as a steak he gets significant right upper quadrant pain.  He recollects that his mother also had her gallbladder taken out.  The pain that he has when he eats a fatty meal was very similar to the pain he had when he had his HIDA scan.  He is still having up to 4-6 bowel movements per day.  Previously was consuming a lot of artificial sugars in his diet which he still continues to do so and has not cut down significantly.  He has not obtained his stool test which we recommend at his last visit.  His diarrhea improved after he was given a course of steroids for his eyes and brought down the number of bowel movements to 4 times a day.    Current Outpatient Medications  Medication Sig Dispense Refill  . aspirin 81 MG EC tablet Take by mouth.    Marland Kitchen atorvastatin (LIPITOR) 20 MG tablet Take by mouth.    Marland Kitchen atorvastatin (LIPITOR) 80 MG tablet Take 80 mg by mouth daily. (Patient not taking: Reported on 09/19/2020)    . cephALEXin (KEFLEX) 500 MG capsule Take by mouth. (Patient not taking: Reported on 09/19/2020)    . cyclobenzaprine (FLEXERIL) 10 MG tablet Take by mouth. (Patient not taking: Reported on 09/19/2020)    . diazepam (VALIUM) 5 MG tablet SMARTSIG:1 Tablet(s) By  Mouth (Patient not taking: Reported on 09/19/2020)    . diphenoxylate-atropine (LOMOTIL) 2.5-0.025 MG tablet Take 1 tablet by mouth every 8 (eight) hours as needed. (Patient not taking: Reported on 09/19/2020)    . erythromycin ophthalmic ointment 2 (two) times daily. (Patient not taking: Reported on 09/19/2020)    . gabapentin (NEURONTIN) 100 MG capsule Take by mouth. (Patient not taking: Reported on 09/19/2020)    . hydrOXYzine (ATARAX/VISTARIL) 25 MG tablet  (Patient not taking: Reported on 09/19/2020)    . ibuprofen  (ADVIL) 400 MG tablet Take by mouth. (Patient not taking: Reported on 09/19/2020)     No current facility-administered medications for this visit.    Allergies as of 11/21/2020 - Review Complete 11/21/2020  Allergen Reaction Noted  . Codeine Itching 06/27/2016  . Hydrocodone Itching 08/30/2015  . Oxycodone Itching 08/30/2015    ROS:  General: Negative for anorexia, weight loss, fever, chills, fatigue, weakness. ENT: Negative for hoarseness, difficulty swallowing , nasal congestion. CV: Negative for chest pain, angina, palpitations, dyspnea on exertion, peripheral edema.  Respiratory: Negative for dyspnea at rest, dyspnea on exertion, cough, sputum, wheezing.  GI: See history of present illness. GU:  Negative for dysuria, hematuria, urinary incontinence, urinary frequency, nocturnal urination.  Endo: Negative for unusual weight change.    Physical Examination:   BP (!) 137/96   Pulse 97   Temp 98.1 F (36.7 C)   Ht 6' (1.829 m)   Wt 245 lb (111.1 kg)   BMI 33.23 kg/m   General: Well-nourished, well-developed in no acute distress.  Eyes: No icterus. Conjunctivae pink. Mouth: Oropharyngeal mucosa moist and pink , no lesions erythema or exudate. Lungs: Clear to auscultation bilaterally. Non-labored. Heart: Regular rate and rhythm, no murmurs rubs or gallops.  Abdomen: Bowel sounds are normal, nontender, nondistended, no hepatosplenomegaly or masses, no abdominal bruits or hernia , no rebound or guarding.   Extremities: No lower extremity edema. No clubbing or deformities. Neuro: Alert and oriented x 3.  Grossly intact. Skin: Warm and dry, no jaundice.   Psych: Alert and cooperative, normal mood and affect.   Imaging Studies: No results found.  Assessment and Plan:   Harvy Riera is a 60 y.o. y/o male  here to follow up for  diarrhea since undergoing abdominal surgery where he was treated for a diaphragmatic rupture and had a part of the small bowel resected.His  history is very suggestive of IBS diarrhea or may have microscopic colitis. 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   He may also have diarrhea secondary to excess consumption of sugars natural and artificial in his diet.  He has not responded to Creon in the past.  Plan 1.  Stop all artificial sugars and sweeteners.  If the diarrhea does not resolve in a week 10 days proceed to obtaining his stool studies which were ordered at his last visit.  If stool studies are negative can consider a course of Xifaxan for IBS diarrhea.  If it does not help we will need a colonoscopy to rule out microscopic colitis.  2.  Abnormal HIDA scan in terms of right upper quadrant significant pain while he drank Ensure and this is the similar pain he gets every time he eats a fatty meal.  Family history of gallbladder disease.  I will refer him to Dr. Dahlia Byes to consider whether he would benefit from a cholecystectomy    Dr Bailey Mech  Vicente Males  MD,MRCP [facility]) Follow up in 6 to 8 weeks video visit

## 2020-11-23 ENCOUNTER — Other Ambulatory Visit: Payer: Self-pay

## 2020-11-23 DIAGNOSIS — K529 Noninfective gastroenteritis and colitis, unspecified: Secondary | ICD-10-CM

## 2020-11-23 DIAGNOSIS — R948 Abnormal results of function studies of other organs and systems: Secondary | ICD-10-CM

## 2020-12-31 ENCOUNTER — Other Ambulatory Visit: Payer: Self-pay

## 2020-12-31 ENCOUNTER — Encounter: Payer: Self-pay | Admitting: Surgery

## 2020-12-31 ENCOUNTER — Ambulatory Visit (INDEPENDENT_AMBULATORY_CARE_PROVIDER_SITE_OTHER): Payer: BLUE CROSS/BLUE SHIELD | Admitting: Surgery

## 2020-12-31 ENCOUNTER — Other Ambulatory Visit (HOSPITAL_COMMUNITY): Payer: Self-pay | Admitting: Optometry

## 2020-12-31 VITALS — BP 126/83 | HR 99 | Temp 98.0°F | Ht 72.0 in | Wt 245.8 lb

## 2020-12-31 DIAGNOSIS — R079 Chest pain, unspecified: Secondary | ICD-10-CM

## 2020-12-31 DIAGNOSIS — R1011 Right upper quadrant pain: Secondary | ICD-10-CM | POA: Diagnosis not present

## 2020-12-31 NOTE — Patient Instructions (Addendum)
We will send request to obtain your Operative Note from [facility].  Ct Chest /Abdomen and Pelvis scheduled 01/15/21 @ 7:45 am @ Outpatient [facility]. Please go pick up your prep kit with instructions today. Please do not eat/drink 4 hours prior to having your scan.   Please see your follow up appointment listed below.

## 2020-12-31 NOTE — Progress Notes (Signed)
Patient ID: Jack Shaw, male   DOB: 1961-05-25, 60 y.o.   MRN: [id number]  HPI Jack Shaw is a 60 y.o. male Jack Shaw seen in consultation at the request of Dr. Vicente Males. 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 Patient was taken to the operating room on 02/10/2020 by Dr. Wynelle Cleveland who performed a diaphragm plication. Overall, he tolerated the procedure without significant difficulty or evident complication and was transferred to the recovery room in stable condition.  On POD1 the patient experienced multiple episodes of emesis and  A CT scan was performed confirming the suspicion and the patient was taken to the OR for a diaphragm reduction with mesh, and exploratory laparotomy with small bowel resection for an internal hernia discovered in the operating room he did stay in the hospital for close to 2 weeks following those two major chest and abdominal operations. He did have some chronic chest pain but now presents with a bulge on the right subcostal area and chronic sharp pain on RUQ.  Reports that the pain is exacerbated with some heavy greasy meals.  He does have chronic diarrhea.  Last time he went to [facility] was about 8 months ago where a CT scan was performed and there was no evidence of any acute intra-abdominal or thoracic abnormalities.  He has lost trust within the [facility]. He was seen by Dr. Vicente Males from GI who will order a ultrasound as well as a HIDA scan.  I have personally reviewed the status showing no evidence of cholelithiasis and some steatosis.  HIDA scan showed normal ejection fraction but he did have reproducible symptoms with this study. He also endorses bloating and abdominal distention. Apparently last year had an EGD and colonoscopy. He is able to perform more than 4 METS of activity but he also reports that he feels very tired and exhausted ever since he had the major  surgery at [facility]. CBC was completely normal BMP 5 months ago showed creatinine of 1.5.    HPI  Past Medical History:  Diagnosis Date  . Asthma   . Chronic kidney disease   . Dizziness   . Hyperlipidemia   . Hypertension   . NAION (non-arteritic anterior ischemic optic neuropathy), right   . Shortness of breath   . Sleep apnea   . Tinnitus   . Vascular spasm ([place])   . Weight loss     Past Surgical History:  Procedure Laterality Date  . APPENDECTOMY    . HERNIA REPAIR    . ROTATOR CUFF REPAIR    . SKIN GRAFT    . SMALL INTESTINE SURGERY      Family History  Problem Relation Age of Onset  . Dementia Mother   . Cancer Father     Social History Social History   Tobacco Use  . Smoking status: Never Smoker  . Smokeless tobacco: Never Used  Substance Use Topics  . Alcohol use: Never  . Drug use: Never    Allergies  Allergen Reactions  . Codeine Itching    Other reaction(s): psychological reaction, Unable to Obtain  . Hydrocodone Itching    Other reaction(s): Not Reported This Time, rash/itching  . Oxycodone Itching    Other reaction(s): rash/itching    Current Outpatient Medications  Medication Sig Dispense Refill  . aspirin 81 MG EC tablet Take by mouth.    Marland Kitchen atorvastatin (LIPITOR) 20 MG tablet Take by mouth.    Marland Kitchen  atorvastatin (LIPITOR) 80 MG tablet Take 80 mg by mouth daily. (Patient not taking: No sig reported)     No current facility-administered medications for this visit.     Review of Systems Full ROS  was asked and was negative except for the information on the HPI  Physical Exam Blood pressure 126/83, pulse 99, temperature 98 F (36.7 C), temperature source Oral, height 6' (1.829 m), weight 245 lb 12.8 oz (111.5 kg), SpO2 95 %. CONSTITUTIONAL: NAD. EYES: Pupils are equal, round, , Sclera are non-icteric. EARS, NOSE, MOUTH AND THROAT: THe is wearing a mask. Hearing is intact to voice. LYMPH NODES:  Lymph nodes in the neck are  normal. RESPIRATORY:  Lungs are clear. There is normal respiratory effort, w and without pathologic use of accessory muscles. Decrease BS on the right base, Right thoracotomy scar. CARDIOVASCULAR: Heart is regular without murmurs, gallops, or rubs. GI: The abdomen is  Soft, bulge right subcostal area w/o definitive hernia palpated. Prior laparotomy scar. GU: Rectal deferred.   MUSCULOSKELETAL: Normal muscle strength and tone. No cyanosis or edema.   SKIN: Turgor is good and there are no pathologic skin lesions or ulcers. NEUROLOGIC: Motor and sensation is grossly normal. Cranial nerves are grossly intact. PSYCH:  Oriented to person, place and time. Affect is normal.  Data Reviewed  I have personally reviewed the patient's imaging, laboratory findings and medical records.    Assessment/Plan [name] is a 60 year old male with a complex past thoracic and abdominal surgical history.  He did have a fundoplications in the right side complicated with rupture diaphragm and internal hernia with ischemic bowel requiring laparotomy and bowel resection. He has recovered well considering the major insult that he suffered last year.  He does have some acute on chronic right upper quadrant pain that can be related to multiple sources. Thoracotomy chronic pain is well described and can certainly explain what he is experiencing.  He does have a bulge on the right subcostal area that might be related to a potential hernia or rupture of the anterior fascial layers of the abdominal wall.  Finally Gallbladder can potentially create similar symptoms.  Due to the complexity of his symptoms and his complex history the right thing to do is to continue further imaging testing.  I would like to obtain a CT scan of the chest to assess for his diaphragm.  I would like to also obtain a CT scan of the abdomen and pelvis to assess for liver and the gallbladder and any other potential abdominal wall defects that I cannot assess  with the my physical exam.  I will see him back in a few weeks. There is no need for any urgent surgical intervention.  For order of business is to establish an accurate diagnosis before any surgical intervention is entertained.  Please note that I spent significant time reviewing outside records given his complexity, I have spent at least 20 to 25 minutes reviewing outside records.  I have also spent significant time in counseling the patient. Total time spent in this encounter is 80 minutes with greater than 50% spent in coordination and counseling of his care A copy of this report was sent to the referring provider.     Caroleen Hamman, MD FACS General Surgeon 12/31/2020, 10:56 AM

## 2021-01-03 ENCOUNTER — Telehealth (INDEPENDENT_AMBULATORY_CARE_PROVIDER_SITE_OTHER): Payer: BLUE CROSS/BLUE SHIELD | Admitting: Gastroenterology

## 2021-01-03 DIAGNOSIS — R948 Abnormal results of function studies of other organs and systems: Secondary | ICD-10-CM | POA: Diagnosis not present

## 2021-01-03 NOTE — Progress Notes (Signed)
Jack Shaw , MD [address]  [place]  [place], [place] [address]  Main: [phone]  Fax: [phone]   Primary Care Physician: Patient, No Pcp Per (Inactive)  Virtual Visit via Telephone Note  I connected with patient on 01/03/21 at  1:30 PM EDT by telephone and verified that I am speaking with the correct person using two identifiers.   I discussed the limitations, risks, security and privacy concerns of performing an evaluation and management service by telephone and the availability of in person appointments. I also discussed with the patient that there may be a patient responsible charge related to this service. The patient expressed understanding and agreed to proceed.  Location of Patient: Home Location of Provider: Home Persons involved: Patient and provider only   History of Present Illness:   Follow up for abdominal   HPI: Jack Shaw is a 60 y.o. male   Summary of history :  Initially referred and seen on 09/19/2020 for diarrhea .He has a history of small bowel resection, right thoracotomy and laparotomy for acute rupture of diaphragmatic hernia repair. At his office visit with his surgeon in July 2021 was having up to 15 bowel movements a day. He saw me for a second opinion. He has had a lot of bloating,  15 bowel movements a day watery nonbloody associated with a lot of rumbling and gurgling sounds. A lot of flatulence and gaseous distention of his belly. He was given a course of steroids for his eye and significantly improved his symptoms but still having multiple bowel movements a day. Denied any weight loss. He consumes artificial sugars and sweet tea and sodas on a daily basis. Denies any chewing gum. Not taking any over-the-counter medications. He complains of swelling and a bulge on the right side of his abdomen.  July 2021 stool culture was negative. CRP normal in November 2021, hemoglobin 15.6 g.  April 2021 underwent EGD  and colonoscopy at [facility] for prior history of colon polyps and epigastric discomfort. Endoscopy note mentions tortuous colon no specimens collected diverticulosis in the rectum. Suggested to have a repeat colonoscopy in 5 years. EGD demonstrated normal esophagus small hiatal hernia, LA grade a esophagitis. Was given a course of Lomotil but developed itching and had to stop it.  09/26/2020: RUQ usg : Hepatic steatosis 09/26/2020: fecal calprotectin, folate , celiac serology, TSH normal . Low B12 at 178 Bile of Creon for extrapancreatic insufficiency did not help 10/09/2020 HIDA scan: Ejection fraction of 82% he developed right upper quadrant pain after drinking Ensure.   Interval history2/23/2022-01/03/2021   12/31/4020: Seen by Dr Dahlia Byes for cholecystectomy : Plan for CT abdomen and then follow up  Since last visit diarrhea is doing better  Current Outpatient Medications  Medication Sig Dispense Refill  . aspirin 81 MG EC tablet Take by mouth.    Marland Kitchen atorvastatin (LIPITOR) 20 MG tablet Take by mouth.    Marland Kitchen atorvastatin (LIPITOR) 80 MG tablet Take 80 mg by mouth daily. (Patient not taking: No sig reported)     No current facility-administered medications for this visit.    Allergies as of 01/03/2021 - Review Complete 12/31/2020  Allergen Reaction Noted  . Codeine Itching 06/27/2016  . Hydrocodone Itching 08/30/2015  . Oxycodone Itching 08/30/2015    Review of Systems:    All systems reviewed and negative except where noted in HPI.   Observations/Objective:  Labs: CMP  No results found for: NA, K, CL, CO2,  GLUCOSE, BUN, CREATININE, CALCIUM, PROT, ALBUMIN, AST, ALT, ALKPHOS, BILITOT, GFRNONAA, GFRAA No results found for: WBC, HGB, HCT, MCV, PLT  Imaging Studies: No results found.  Assessment and Plan:   [name] is a 60 y.o. y/o male here to follow up fordiarrhea since undergoing abdominal surgery where he was treated for a diaphragmatic rupture and had  a part of the small bowel resected.His history is very suggestive of IBS diarrhea or may have microscopic colitis. The fact that he responded significantly with steroids which was given for his eye condition leans towards the diagnosis ofmicroscopic colitis.  He may also have diarrhea secondary to excess consumption of sugars natural and artificial in his diet.  He has not responded to Creon in the past.  Last visit diarrhea is doing better after stopping artificial sugars and sweeteners.  Plan 1.    At next visit if he has recurrence of diarrhea recheck  stool studies if  negative can consider a course of Xifaxan for IBS diarrhea.  If it does not help we will need a colonoscopy to rule out microscopic colitis.  2.  Abnormal HIDA scan , he is following with surgery and they are going to follow-up with him after a CT scan which he has been scheduled for    I discussed the assessment and treatment plan with the patient. The patient was provided an opportunity to ask questions and all were answered. The patient agreed with the plan and demonstrated an understanding of the instructions.   The patient was advised to call back or seek an in-person evaluation if the symptoms worsen or if the condition fails to improve as anticipated.  I provided 12 minutes of non-face-to-face time during this encounter.  Dr Jack Bellows MD,MRCP [facility]) Gastroenterology/Hepatology Pager: [phone]   Speech recognition software was used to dictate this note.

## 2021-01-15 ENCOUNTER — Other Ambulatory Visit: Payer: BLUE CROSS/BLUE SHIELD

## 2021-01-15 ENCOUNTER — Ambulatory Visit: Admission: RE | Admit: 2021-01-15 | Payer: BLUE CROSS/BLUE SHIELD | Source: Ambulatory Visit

## 2021-01-15 ENCOUNTER — Ambulatory Visit
Admission: RE | Admit: 2021-01-15 | Discharge: 2021-01-15 | Disposition: A | Discharge status: Home / Self Care | Payer: BLUE CROSS/BLUE SHIELD | Source: Ambulatory Visit | Attending: Surgery | Admitting: Surgery

## 2021-01-15 ENCOUNTER — Other Ambulatory Visit: Payer: Self-pay

## 2021-01-15 DIAGNOSIS — R1011 Right upper quadrant pain: Secondary | ICD-10-CM | POA: Diagnosis present

## 2021-01-15 DIAGNOSIS — R079 Chest pain, unspecified: Secondary | ICD-10-CM | POA: Insufficient documentation

## 2021-01-15 LAB — POCT I-STAT CREATININE: Creatinine, Ser: 1.1 mg/dL (ref 0.61–1.24)

## 2021-01-15 MED ORDER — IOHEXOL 300 MG/ML  SOLN
100.0000 mL | Freq: Once | INTRAMUSCULAR | Status: AC | PRN
  Administered 2021-01-15: 100 mL via INTRAVENOUS

## 2021-01-15 NOTE — Progress Notes (Signed)
01/15/21  CT scan images viewed personally and called the patient to discuss findings.  There is no true visible abdominal wall hernia.  However, on his right lower chest, the area of his prior thoracotomy shows an irregularity in the musculature, which may be related to the way the muscles healed after surgery.  This could potentially be the bulging that the patient is sensing.  He also reports that he feels he's having more gallbladder issues with pain radiating to his back.  Recommended that he keep his appointment with Dr. Dahlia Byes as scheduled on 4/25 to discuss the CT scan results further as well as his current symptoms and further plans.  Patient is in agreement and all questions were answered.  Olean Ree, MD

## 2021-01-21 ENCOUNTER — Other Ambulatory Visit: Payer: Self-pay

## 2021-01-21 ENCOUNTER — Encounter: Payer: Self-pay | Admitting: Surgery

## 2021-01-21 ENCOUNTER — Telehealth: Payer: Self-pay | Admitting: Surgery

## 2021-01-21 ENCOUNTER — Ambulatory Visit (INDEPENDENT_AMBULATORY_CARE_PROVIDER_SITE_OTHER): Payer: BLUE CROSS/BLUE SHIELD | Admitting: Surgery

## 2021-01-21 VITALS — BP 138/87 | HR 87 | Temp 98.0°F | Ht 72.0 in | Wt 245.0 lb

## 2021-01-21 DIAGNOSIS — K8050 Calculus of bile duct without cholangitis or cholecystitis without obstruction: Secondary | ICD-10-CM | POA: Diagnosis not present

## 2021-01-21 NOTE — Telephone Encounter (Signed)
Patient has been advised of Pre-Admission date/time, COVID Testing date and Surgery date.  Surgery Date: 01/24/21 Preadmission Testing Date: 01/22/21 (phone 1p-5p) Covid Testing Date: 01/23/21 in person @ 8:15 am - patient advised to go to the [place] ([place])   Patient has been made aware to call [phone], between 1-3:00pm the day before surgery, to find out what time to arrive for surgery.

## 2021-01-21 NOTE — Patient Instructions (Signed)
You have requested to have your gallbladder removed. This will be done at [facility] with Dr. Pabon. ° °You will most likely be out of work 1-2 weeks for this surgery. You will return after your post-op appointment with a lifting restriction for approximately 4 more weeks. ° °You will be able to eat anything you would like to following surgery. But, start by eating a bland diet and advance this as tolerated. The Gallbladder diet is below, please go as closely by this diet as possible prior to surgery to avoid any further attacks. ° °Please see the (blue)pre-care form that you have been given today.  °Our surgery scheduler will call you to look at surgery dates and to go over information.  ° °If you have any questions, please call our office. ° °Laparoscopic Cholecystectomy °Laparoscopic cholecystectomy is surgery to remove the gallbladder. The gallbladder is located in the upper right part of the abdomen, behind the liver. It is a storage sac for bile, which is produced in the liver. Bile aids in the digestion and absorption of fats. Cholecystectomy is often done for inflammation of the gallbladder (cholecystitis). This condition is usually caused by a buildup of gallstones (cholelithiasis) in the gallbladder. Gallstones can block the flow of bile, and that can result in inflammation and pain. In severe cases, emergency surgery may be required. If emergency surgery is not required, you will have time to prepare for the procedure. °Laparoscopic surgery is an alternative to open surgery. Laparoscopic surgery has a shorter recovery time. Your common bile duct may also need to be examined during the procedure. If stones are found in the common bile duct, they may be removed. °LET YOUR HEALTH CARE PROVIDER KNOW ABOUT: °Any allergies you have. °All medicines you are taking, including vitamins, herbs, eye drops, creams, and over-the-counter medicines. °Previous problems you or members of your family have had with  the use of anesthetics. °Any blood disorders you have. °Previous surgeries you have had. ° °Any medical conditions you have. °RISKS AND COMPLICATIONS °Generally, this is a safe procedure. However, problems may occur, including: °Infection. °Bleeding. °Allergic reactions to medicines. °Damage to other structures or organs. °A stone remaining in the common bile duct. °A bile leak from the cyst duct that is clipped when your gallbladder is removed. °The need to convert to open surgery, which requires a larger incision in the abdomen. This may be necessary if your surgeon thinks that it is not safe to continue with a laparoscopic procedure. °BEFORE THE PROCEDURE °Ask your health care provider about: °Changing or stopping your regular medicines. This is especially important if you are taking diabetes medicines or blood thinners. °Taking medicines such as aspirin and ibuprofen. These medicines can thin your blood. Do not take these medicines before your procedure if your health care provider instructs you not to. °Follow instructions from your health care provider about eating or drinking restrictions. °Let your health care provider know if you develop a cold or an infection before surgery. °Plan to have someone take you home after the procedure. °Ask your health care provider how your surgical site will be marked or identified. °You may be given antibiotic medicine to help prevent infection. °PROCEDURE °To reduce your risk of infection: °Your health care team will wash or sanitize their hands. °Your skin will be washed with soap. °An IV tube may be inserted into one of your veins. °You will be given a medicine to make you fall asleep (general anesthetic). °A breathing tube will be   placed in your mouth. °The surgeon will make several small cuts (incisions) in your abdomen. °A thin, lighted tube (laparoscope) that has a tiny camera on the end will be inserted through one of the small incisions. The camera on the  laparoscope will send a picture to a TV screen (monitor) in the operating room. This will give the surgeon a good view inside your abdomen. °A gas will be pumped into your abdomen. This will expand your abdomen to give the surgeon more room to perform the surgery. °Other tools that are needed for the procedure will be inserted through the other incisions. The gallbladder will be removed through one of the incisions. °After your gallbladder has been removed, the incisions will be closed with stitches (sutures), staples, or skin glue. °Your incisions may be covered with a bandage (dressing). °The procedure may vary among health care providers and hospitals. °AFTER THE PROCEDURE °Your blood pressure, heart rate, breathing rate, and blood oxygen level will be monitored often until the medicines you were given have worn off. °You will be given medicines as needed to control your pain. °  °This information is not intended to replace advice given to you by your health care provider. Make sure you discuss any questions you have with your health care provider. °  °Document Released: 09/15/2005 Document Revised: 06/06/2015 Document Reviewed: 04/27/2013 °Elsevier Interactive Patient Education ©2016 [employer] ° ° °Low-Fat Diet for Gallbladder Conditions °A low-fat diet can be helpful if you have pancreatitis or a gallbladder condition. With these conditions, your pancreas and gallbladder have trouble digesting fats. A healthy eating plan with less fat will help rest your pancreas and gallbladder and reduce your symptoms. °WHAT DO I NEED TO KNOW ABOUT THIS DIET? °Eat a low-fat diet. °Reduce your fat intake to less than 20-30% of your total daily calories. This is less than 50-60 g of fat per day. °Remember that you need some fat in your diet. Ask your dietician what your daily goal should be. °Choose nonfat and low-fat healthy foods. Look for the words "nonfat," "low fat," or "fat free." °As a guide, look on the label and  choose foods with less than 3 g of fat per serving. Eat only one serving. °Avoid alcohol. °Do not smoke. If you need help quitting, talk with your health care provider. °Eat small frequent meals instead of three large heavy meals. °WHAT FOODS CAN I EAT? °Grains °Include healthy grains and starches such as potatoes, wheat bread, fiber-rich cereal, and brown rice. Choose whole grain options whenever possible. In adults, whole grains should account for 45-65% of your daily calories.  °Fruits and Vegetables °Eat plenty of fruits and vegetables. Fresh fruits and vegetables add fiber to your diet. °Meats and Other Protein Sources °Eat lean meat such as chicken and pork. Trim any fat off of meat before cooking it. Eggs, fish, and beans are other sources of protein. In adults, these foods should account for 10-35% of your daily calories. °Dairy °Choose low-fat milk and dairy options. Dairy includes fat and protein, as well as calcium.  °Fats and Oils °Limit high-fat foods such as fried foods, sweets, baked goods, sugary drinks.  °Other °Creamy sauces and condiments, such as mayonnaise, can add extra fat. Think about whether or not you need to use them, or use smaller amounts or low fat options. °WHAT FOODS ARE NOT RECOMMENDED? °High fat foods, such as: °Baked goods. °Ice cream. °French toast. °Sweet rolls. °Pizza. °Cheese bread. °Foods covered with batter, butter, creamy   sauces, or cheese. °Fried foods. °Sugary drinks and desserts. °Foods that cause gas or bloating °  °This information is not intended to replace advice given to you by your health care provider. Make sure you discuss any questions you have with your health care provider. °  °Document Released: 09/20/2013 Document Reviewed: 09/20/2013 °Elsevier Interactive Patient Education ©2016 Elsevier Inc. ° ° °

## 2021-01-21 NOTE — H&P (View-Only) (Signed)
Outpatient Surgical Follow Up  01/21/2021  Jack Shaw is an 60 y.o. male.   Chief Complaint  Patient presents with  . Follow-up    HPI: 60 year old male with intermittent chronic right upper quadrant pain.  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  He did not require a laparotomy given that he had strangulated bowel.  He also required small bowel resection.  He is here for recurrent right upper quadrant pain of unknown etiology.  He did have a recent CT scan of the chest and abdomen that I have personally reviewed showing no evidence of any intra abdominal processes to account for his patient's symptoms.  Right chest without evidence of hernia recurrence.  There is no evidence of pneumonia or pneumothorax. He was seen by Dr. Vicente Males from GI who will order a ultrasound as well as a HIDA scan.  I have personally reviewed the status showing no evidence of cholelithiasis and some steatosis.  HIDA scan showed normal ejection fraction but he did have reproducible symptoms with this study. He continues to endorse right upper quadrant pain that is triggered by heavy meals usually about 30 minutes after that.  He denies any fevers chills or any evidence of biliary obstruction  Past Medical History:  Diagnosis Date  . Asthma   . Chronic kidney disease   . Dizziness   . Hyperlipidemia   . Hypertension   . NAION (non-arteritic anterior ischemic optic neuropathy), right   . Shortness of breath   . Sleep apnea   . Tinnitus   . Vascular spasm ([place])   . Weight loss     Past Surgical History:  Procedure Laterality Date  . APPENDECTOMY    . HERNIA REPAIR    . ROTATOR CUFF REPAIR    . SKIN GRAFT    . SMALL INTESTINE SURGERY      Family History  Problem Relation Age of Onset  . Dementia Mother   . Cancer Father     Social History:  reports that he has never smoked. He has never used smokeless tobacco. He reports  that he does not drink alcohol and does not use drugs.  Allergies:  Allergies  Allergen Reactions  . Codeine Itching    Other reaction(s): psychological reaction, Unable to Obtain  . Hydrocodone Itching and Rash  . Oxycodone Itching and Rash  . Diphenoxylate-Atropine Rash    Diffuse with persistent itching    Medications reviewed.    ROS Full ROS performed and is otherwise negative other than what is stated in HPI   BP 138/87   Pulse 87   Temp 98 F (36.7 C)   Ht 6' (1.829 m)   Wt 245 lb (111.1 kg)   SpO2 96%   BMI 33.23 kg/m   Physical Exam  Physical Exam CONSTITUTIONAL: NAD. EYES: Pupils are equal, round, , Sclera are non-icteric. EARS, NOSE, MOUTH AND THROAT: THe is wearing a mask. Hearing is intact to voice. LYMPH NODES:  Lymph nodes in the neck are normal. RESPIRATORY:  Lungs are clear. There is normal respiratory effort, w and without pathologic use of accessory muscles. Decrease BS on the right base, Right thoracotomy scar. CARDIOVASCULAR: Heart is regular without murmurs, gallops, or rubs. GI: The abdomen is  Soft, bulge right subcostal area w/o definitive hernia palpated. Prior laparotomy scar. GU: Rectal deferred.   MUSCULOSKELETAL: Normal muscle strength and tone. No cyanosis or edema.   SKIN: Turgor is  good and there are no pathologic skin lesions or ulcers. NEUROLOGIC: Motor and sensation is grossly normal. Cranial nerves are grossly intact. PSYCH:  Oriented to person, place and time. Affect is normal.   Assessment/Plan:  1. Biliary colic in a patient with multiple abdominal operations.  CT scan shows no evidence of any abdominal wall hernias.  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  No evidence of other acute abnormality in the right upper quadrant.  Once again I reiterated that we were in a great area.  He does have some clinical symptoms that are consistent with biliary colic and a HIDA scan that was simple his  symptoms although his ejection fraction was completely normal.  Extensive discussion with the patient in detail. Options of observation versus cholecystectomy were discussed at length.  Since there is no other specific findings to account for his pain the patient wishes and does have a strong feeling about proceeding with cholecystectomy.  Cholecystectomy may not resolve his symptoms.  He understands this   I discussed the procedure in detail.  The patient was given Neurosurgeon.  We discussed the risks and benefits of a laparoscopic cholecystectomy and possible cholangiogram including, but not limited to bleeding, infection, injury to surrounding structures such as the intestine or liver, bile leak, retained gallstones, need to convert to an open procedure, prolonged diarrhea, blood clots such as  DVT, common bile duct injury, anesthesia risks, and possible need for additional procedures.  The likelihood of improvement in symptoms and return to the patient's normal status is good. We discussed the typical post-operative recovery course. Greater than 50% of the 40 minutes  visit was spent in counseling/coordination of care   Caroleen Hamman, MD [place] Surgeon

## 2021-01-21 NOTE — Progress Notes (Signed)
Outpatient Surgical Follow Up  01/21/2021  Jack Shaw is an 60 y.o. male.   Chief Complaint  Patient presents with  . Follow-up    HPI: 60 year old male with intermittent chronic right upper quadrant pain.  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  He did not require a laparotomy given that he had strangulated bowel.  He also required small bowel resection.  He is here for recurrent right upper quadrant pain of unknown etiology.  He did have a recent CT scan of the chest and abdomen that I have personally reviewed showing no evidence of any intra abdominal processes to account for his patient's symptoms.  Right chest without evidence of hernia recurrence.  There is no evidence of pneumonia or pneumothorax. He was seen by Dr. Vicente Males from GI who will order a ultrasound as well as a HIDA scan.  I have personally reviewed the status showing no evidence of cholelithiasis and some steatosis.  HIDA scan showed normal ejection fraction but he did have reproducible symptoms with this study. He continues to endorse right upper quadrant pain that is triggered by heavy meals usually about 30 minutes after that.  He denies any fevers chills or any evidence of biliary obstruction  Past Medical History:  Diagnosis Date  . Asthma   . Chronic kidney disease   . Dizziness   . Hyperlipidemia   . Hypertension   . NAION (non-arteritic anterior ischemic optic neuropathy), right   . Shortness of breath   . Sleep apnea   . Tinnitus   . Vascular spasm ([place])   . Weight loss     Past Surgical History:  Procedure Laterality Date  . APPENDECTOMY    . HERNIA REPAIR    . ROTATOR CUFF REPAIR    . SKIN GRAFT    . SMALL INTESTINE SURGERY      Family History  Problem Relation Age of Onset  . Dementia Mother   . Cancer Father     Social History:  reports that he has never smoked. He has never used smokeless tobacco. He reports  that he does not drink alcohol and does not use drugs.  Allergies:  Allergies  Allergen Reactions  . Codeine Itching    Other reaction(s): psychological reaction, Unable to Obtain  . Hydrocodone Itching and Rash  . Oxycodone Itching and Rash  . Diphenoxylate-Atropine Rash    Diffuse with persistent itching    Medications reviewed.    ROS Full ROS performed and is otherwise negative other than what is stated in HPI   BP 138/87   Pulse 87   Temp 98 F (36.7 C)   Ht 6' (1.829 m)   Wt 245 lb (111.1 kg)   SpO2 96%   BMI 33.23 kg/m   Physical Exam  Physical Exam CONSTITUTIONAL: NAD. EYES: Pupils are equal, round, , Sclera are non-icteric. EARS, NOSE, MOUTH AND THROAT: THe is wearing a mask. Hearing is intact to voice. LYMPH NODES:  Lymph nodes in the neck are normal. RESPIRATORY:  Lungs are clear. There is normal respiratory effort, w and without pathologic use of accessory muscles. Decrease BS on the right base, Right thoracotomy scar. CARDIOVASCULAR: Heart is regular without murmurs, gallops, or rubs. GI: The abdomen is  Soft, bulge right subcostal area w/o definitive hernia palpated. Prior laparotomy scar. GU: Rectal deferred.   MUSCULOSKELETAL: Normal muscle strength and tone. No cyanosis or edema.   SKIN: Turgor is  good and there are no pathologic skin lesions or ulcers. NEUROLOGIC: Motor and sensation is grossly normal. Cranial nerves are grossly intact. PSYCH:  Oriented to person, place and time. Affect is normal.   Assessment/Plan:  1. Biliary colic in a patient with multiple abdominal operations.  CT scan shows no evidence of any abdominal wall hernias.  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  No evidence of other acute abnormality in the right upper quadrant.  Once again I reiterated that we were in a great area.  He does have some clinical symptoms that are consistent with biliary colic and a HIDA scan that was simple his  symptoms although his ejection fraction was completely normal.  Extensive discussion with the patient in detail. Options of observation versus cholecystectomy were discussed at length.  Since there is no other specific findings to account for his pain the patient wishes and does have a strong feeling about proceeding with cholecystectomy.  Cholecystectomy may not resolve his symptoms.  He understands this   I discussed the procedure in detail.  The patient was given Neurosurgeon.  We discussed the risks and benefits of a laparoscopic cholecystectomy and possible cholangiogram including, but not limited to bleeding, infection, injury to surrounding structures such as the intestine or liver, bile leak, retained gallstones, need to convert to an open procedure, prolonged diarrhea, blood clots such as  DVT, common bile duct injury, anesthesia risks, and possible need for additional procedures.  The likelihood of improvement in symptoms and return to the patient's normal status is good. We discussed the typical post-operative recovery course. Greater than 50% of the 40 minutes  visit was spent in counseling/coordination of care   Caroleen Hamman, MD [place] Surgeon

## 2021-01-22 ENCOUNTER — Encounter
Admission: RE | Admit: 2021-01-22 | Discharge: 2021-01-22 | Disposition: A | Discharge status: Home / Self Care | Payer: BLUE CROSS/BLUE SHIELD | Source: Ambulatory Visit | Attending: Surgery | Admitting: Surgery

## 2021-01-22 ENCOUNTER — Other Ambulatory Visit: Payer: Self-pay

## 2021-01-22 HISTORY — DX: Pneumonia, unspecified organism: J18.9

## 2021-01-22 HISTORY — DX: Benign prostatic hyperplasia without lower urinary tract symptoms: N40.0

## 2021-01-22 HISTORY — DX: Personal history of urinary calculi: Z87.442

## 2021-01-22 NOTE — Patient Instructions (Addendum)
Your procedure is scheduled on: Thursday, April 28  Report to the Registration Desk on the 1st floor of the [facility]. To find out your arrival time, please call [phone] between 1PM - 3PM on: Wednesday, April 27  REMEMBER: Instructions that are not followed completely may result in serious medical risk, up to and including death; or upon the discretion of your surgeon and anesthesiologist your surgery may need to be rescheduled.  Do not eat food after midnight the night before surgery.  No gum chewing, lozengers or hard candies.  You may however, drink CLEAR liquids up to 2 hours before you are scheduled to arrive for your surgery. Do not drink anything within 2 hours of your scheduled arrival time.  Clear liquids include: - water  - apple juice without pulp - gatorade (not RED, PURPLE, OR BLUE) - black coffee or tea (Do NOT add milk or creamers to the coffee or tea) Do NOT drink anything that is not on this list.  DO NOT TAKE ANY MEDICATIONS THE MORNING OF SURGERY   One week prior to surgery: STARTING TODAY, April 26 Stop ASPIRIN AND Anti-inflammatories (NSAIDS) such as Advil, Aleve, Ibuprofen, Motrin, Naproxen, Naprosyn and Aspirin based products such as Excedrin, Goodys Powder, BC Powder. Stop ANY OVER THE COUNTER supplements until after surgery.  No Alcohol for 24 hours before or after surgery.  No Smoking including e-cigarettes for 24 hours prior to surgery.  No chewable tobacco products for at least 6 hours prior to surgery.  No nicotine patches on the day of surgery.  Do not use any "recreational" drugs for at least a week prior to your surgery.  Please be advised that the combination of cocaine and anesthesia may have negative outcomes, up to and including death. If you test positive for cocaine, your surgery will be cancelled.  On the morning of surgery brush your teeth with toothpaste and water, you may rinse your mouth with mouthwash if you wish. Do not  swallow any toothpaste or mouthwash.  Do not wear jewelry.  Do not wear lotions, powders, or perfumes.   Do not shave body from the neck down 48 hours prior to surgery just in case you cut yourself which could leave a site for infection.  Also, freshly shaved skin may become irritated if using the CHG soap.  Contact lenses, hearing aids and dentures may not be worn into surgery.  Do not bring valuables to the hospital. [facility] is not responsible for any missing/lost belongings or valuables.   Use CHG Soap as directed on instruction sheet.  Notify your doctor if there is any change in your medical condition (cold, fever, infection).  Wear comfortable clothing (specific to your surgery type) to the hospital.  Plan for stool softeners for home use; pain medications have a tendency to cause constipation. You can also help prevent constipation by eating foods high in fiber such as fruits and vegetables and drinking plenty of fluids as your diet allows.  After surgery, you can help prevent lung complications by doing breathing exercises.  Take deep breaths and cough every 1-2 hours. Your doctor may order a device called an Incentive Spirometer to help you take deep breaths. When coughing or sneezing, hold a pillow firmly against your incision with both hands. This is called "splinting." Doing this helps protect your incision. It also decreases belly discomfort.  If you are being discharged the day of surgery, you will not be allowed to drive home. You will need  a responsible adult (18 years or older) to drive you home and stay with you that night.   If you are taking public transportation, you will need to have a responsible adult (18 years or older) with you. Please confirm with your physician that it is acceptable to use public transportation.   Please call the [facility] at [phone] if you have any questions about these instructions.  Surgery Visitation  Policy:  Patients undergoing a surgery or procedure may have one family member or support person with them as long as that person is not COVID-19 positive or experiencing its symptoms.  That person may remain in the waiting area during the procedure.

## 2021-01-23 ENCOUNTER — Other Ambulatory Visit: Admission: RE | Admit: 2021-01-23 | Payer: BLUE CROSS/BLUE SHIELD | Source: Ambulatory Visit

## 2021-01-23 ENCOUNTER — Encounter
Admission: RE | Admit: 2021-01-23 | Discharge: 2021-01-23 | Disposition: A | Discharge status: Home / Self Care | Payer: BLUE CROSS/BLUE SHIELD | Source: Ambulatory Visit | Attending: Surgery | Admitting: Surgery

## 2021-01-23 DIAGNOSIS — K8050 Calculus of bile duct without cholangitis or cholecystitis without obstruction: Secondary | ICD-10-CM | POA: Diagnosis present

## 2021-01-23 DIAGNOSIS — Z01818 Encounter for other preprocedural examination: Secondary | ICD-10-CM | POA: Insufficient documentation

## 2021-01-23 DIAGNOSIS — K811 Chronic cholecystitis: Secondary | ICD-10-CM | POA: Diagnosis not present

## 2021-01-23 DIAGNOSIS — Z885 Allergy status to narcotic agent status: Secondary | ICD-10-CM | POA: Diagnosis not present

## 2021-01-23 DIAGNOSIS — T8144XA Sepsis following a procedure, initial encounter: Secondary | ICD-10-CM | POA: Diagnosis not present

## 2021-01-23 DIAGNOSIS — K660 Peritoneal adhesions (postprocedural) (postinfection): Secondary | ICD-10-CM | POA: Diagnosis not present

## 2021-01-23 DIAGNOSIS — I10 Essential (primary) hypertension: Secondary | ICD-10-CM | POA: Insufficient documentation

## 2021-01-23 DIAGNOSIS — Z20822 Contact with and (suspected) exposure to covid-19: Secondary | ICD-10-CM | POA: Insufficient documentation

## 2021-01-23 DIAGNOSIS — E860 Dehydration: Secondary | ICD-10-CM | POA: Diagnosis not present

## 2021-01-23 DIAGNOSIS — Z9049 Acquired absence of other specified parts of digestive tract: Secondary | ICD-10-CM | POA: Diagnosis not present

## 2021-01-23 LAB — BASIC METABOLIC PANEL
Anion gap: 8 (ref 5–15)
BUN: 12 mg/dL (ref 8–23)
CO2: 26 mmol/L (ref 22–32)
Calcium: 9.1 mg/dL (ref 8.9–10.3)
Chloride: 106 mmol/L (ref 98–111)
Creatinine, Ser: 1.18 mg/dL (ref 0.61–1.24)
GFR, Estimated: >60 mL/min (ref >60)
Glucose, Bld: 112 mg/dL — ABNORMAL HIGH (ref 70–99)
Potassium: 3.9 mmol/L (ref 3.5–5.1)
Sodium: 140 mmol/L (ref 135–145)

## 2021-01-23 LAB — CBC
HCT: 42.9 % (ref 39.0–52.0)
Hemoglobin: 14.6 g/dL (ref 13.0–17.0)
MCH: 31.5 pg (ref 26.0–34.0)
MCHC: 34 g/dL (ref 30.0–36.0)
MCV: 92.5 fL (ref 80.0–100.0)
Platelets: 196 10*3/uL (ref 150–400)
RBC: 4.64 MIL/uL (ref 4.22–5.81)
RDW: 12.6 % (ref 11.5–15.5)
WBC: 7.3 10*3/uL (ref 4.0–10.5)
nRBC: 0 % (ref 0.0–0.2)

## 2021-01-23 LAB — SARS CORONAVIRUS 2 (TAT 6-24 HRS): SARS Coronavirus 2: NEGATIVE

## 2021-01-23 MED ORDER — INDOCYANINE GREEN 25 MG IV SOLR
5.0000 mg | Freq: Once | INTRAVENOUS | Status: AC
  Administered 2021-01-24: 5 mg via INTRAVENOUS
  Filled 2021-01-23: qty 2

## 2021-01-23 MED ORDER — CEFAZOLIN SODIUM-DEXTROSE 2-4 GM/100ML-% IV SOLN
2.0000 g | INTRAVENOUS | Status: AC
  Administered 2021-01-24: 2 g via INTRAVENOUS

## 2021-01-23 MED ORDER — ACETAMINOPHEN 500 MG PO TABS: 1000.0000 mg | ORAL_TABLET | ORAL | Status: AC

## 2021-01-23 MED ORDER — ORAL CARE MOUTH RINSE: 15.0000 mL | Freq: Once | OROMUCOSAL | Status: AC

## 2021-01-23 MED ORDER — FAMOTIDINE 20 MG PO TABS: 20.0000 mg | ORAL_TABLET | Freq: Once | ORAL | Status: AC

## 2021-01-23 MED ORDER — GABAPENTIN 300 MG PO CAPS
300.0000 mg | ORAL_CAPSULE | ORAL | Status: AC
Start: 2021-01-24 — End: 2021-01-24

## 2021-01-23 MED ORDER — CELECOXIB 200 MG PO CAPS: 200.0000 mg | ORAL_CAPSULE | ORAL | Status: AC

## 2021-01-23 MED ORDER — CHLORHEXIDINE GLUCONATE CLOTH 2 % EX PADS
6.0000 | MEDICATED_PAD | Freq: Once | CUTANEOUS | Status: DC
Start: 2021-01-23 — End: 2021-01-24

## 2021-01-23 MED ORDER — CHLORHEXIDINE GLUCONATE 0.12 % MT SOLN: 15.0000 mL | Freq: Once | OROMUCOSAL | Status: AC

## 2021-01-23 MED ORDER — LACTATED RINGERS IV SOLN: INTRAVENOUS | Status: DC

## 2021-01-24 ENCOUNTER — Other Ambulatory Visit: Payer: Self-pay

## 2021-01-24 ENCOUNTER — Ambulatory Visit: Payer: BLUE CROSS/BLUE SHIELD | Admitting: Urgent Care

## 2021-01-24 ENCOUNTER — Encounter: Payer: Self-pay | Admitting: Surgery

## 2021-01-24 ENCOUNTER — Encounter
Admission: RE | Disposition: A | Discharge status: Home / Self Care | Payer: Self-pay | Source: Home / Self Care | Attending: Surgery

## 2021-01-24 ENCOUNTER — Ambulatory Visit
Admission: RE | Admit: 2021-01-24 | Discharge: 2021-01-24 | Disposition: A | Discharge status: Home / Self Care | Payer: BLUE CROSS/BLUE SHIELD | Attending: Surgery | Admitting: Surgery

## 2021-01-24 DIAGNOSIS — K811 Chronic cholecystitis: Secondary | ICD-10-CM

## 2021-01-24 DIAGNOSIS — Z885 Allergy status to narcotic agent status: Secondary | ICD-10-CM | POA: Diagnosis not present

## 2021-01-24 DIAGNOSIS — Z9049 Acquired absence of other specified parts of digestive tract: Secondary | ICD-10-CM | POA: Diagnosis not present

## 2021-01-24 DIAGNOSIS — K8050 Calculus of bile duct without cholangitis or cholecystitis without obstruction: Secondary | ICD-10-CM

## 2021-01-24 DIAGNOSIS — K660 Peritoneal adhesions (postprocedural) (postinfection): Secondary | ICD-10-CM

## 2021-01-24 SURGERY — CHOLECYSTECTOMY, ROBOT-ASSISTED, LAPAROSCOPIC
Anesthesia: General

## 2021-01-24 MED ORDER — DEXMEDETOMIDINE (PRECEDEX) IN NS 20 MCG/5ML (4 MCG/ML) IV SYRINGE
PREFILLED_SYRINGE | INTRAVENOUS | Status: AC
  Filled 2021-01-24: qty 5

## 2021-01-24 MED ORDER — GABAPENTIN 300 MG PO CAPS
ORAL_CAPSULE | ORAL | Status: AC
  Administered 2021-01-24: 300 mg via ORAL
  Filled 2021-01-24: qty 1

## 2021-01-24 MED ORDER — DEXAMETHASONE SODIUM PHOSPHATE 10 MG/ML IJ SOLN
INTRAMUSCULAR | Status: AC
  Filled 2021-01-24: qty 1

## 2021-01-24 MED ORDER — FENTANYL CITRATE (PF) 100 MCG/2ML IJ SOLN
INTRAMUSCULAR | Status: AC
  Filled 2021-01-24: qty 2

## 2021-01-24 MED ORDER — BUPIVACAINE LIPOSOME 1.3 % IJ SUSP
INTRAMUSCULAR | Status: DC | PRN
  Administered 2021-01-24: 20 mL

## 2021-01-24 MED ORDER — LIDOCAINE HCL (PF) 2 % IJ SOLN
INTRAMUSCULAR | Status: AC
  Filled 2021-01-24: qty 5

## 2021-01-24 MED ORDER — CEFAZOLIN SODIUM-DEXTROSE 2-4 GM/100ML-% IV SOLN
INTRAVENOUS | Status: AC
  Filled 2021-01-24: qty 100

## 2021-01-24 MED ORDER — DEXAMETHASONE SODIUM PHOSPHATE 10 MG/ML IJ SOLN
INTRAMUSCULAR | Status: DC | PRN
  Administered 2021-01-24: 10 mg via INTRAVENOUS

## 2021-01-24 MED ORDER — SUGAMMADEX SODIUM 500 MG/5ML IV SOLN
INTRAVENOUS | Status: DC | PRN
  Administered 2021-01-24: 300 mg via INTRAVENOUS

## 2021-01-24 MED ORDER — ONDANSETRON HCL 4 MG/2ML IJ SOLN
INTRAMUSCULAR | Status: AC
  Filled 2021-01-24: qty 2

## 2021-01-24 MED ORDER — LIDOCAINE HCL (CARDIAC) PF 100 MG/5ML IV SOSY
PREFILLED_SYRINGE | INTRAVENOUS | Status: DC | PRN
  Administered 2021-01-24: 60 mg via INTRAVENOUS

## 2021-01-24 MED ORDER — CHLORHEXIDINE GLUCONATE 0.12 % MT SOLN
OROMUCOSAL | Status: AC
  Administered 2021-01-24: 15 mL via OROMUCOSAL
  Filled 2021-01-24: qty 15

## 2021-01-24 MED ORDER — SODIUM CHLORIDE 0.9 % IV SOLN
INTRAVENOUS | Status: DC | PRN
  Administered 2021-01-24: 25 ug/min via INTRAVENOUS

## 2021-01-24 MED ORDER — PROPOFOL 10 MG/ML IV BOLUS
INTRAVENOUS | Status: AC
  Filled 2021-01-24: qty 20

## 2021-01-24 MED ORDER — PROPOFOL 10 MG/ML IV BOLUS
INTRAVENOUS | Status: DC | PRN
  Administered 2021-01-24: 200 mg via INTRAVENOUS
  Administered 2021-01-24: 50 mg via INTRAVENOUS

## 2021-01-24 MED ORDER — SODIUM CHLORIDE 0.9 % IV SOLN: INTRAVENOUS | Status: DC | PRN

## 2021-01-24 MED ORDER — MIDAZOLAM HCL 2 MG/2ML IJ SOLN
INTRAMUSCULAR | Status: AC
  Filled 2021-01-24: qty 2

## 2021-01-24 MED ORDER — ROCURONIUM BROMIDE 100 MG/10ML IV SOLN
INTRAVENOUS | Status: DC | PRN
  Administered 2021-01-24: 30 mg via INTRAVENOUS
  Administered 2021-01-24: 50 mg via INTRAVENOUS

## 2021-01-24 MED ORDER — FENTANYL CITRATE (PF) 100 MCG/2ML IJ SOLN
INTRAMUSCULAR | Status: DC | PRN
  Administered 2021-01-24 (×4): 50 ug via INTRAVENOUS

## 2021-01-24 MED ORDER — FENTANYL CITRATE (PF) 100 MCG/2ML IJ SOLN
INTRAMUSCULAR | Status: AC
  Administered 2021-01-24: 25 ug via INTRAVENOUS
  Filled 2021-01-24: qty 2

## 2021-01-24 MED ORDER — BUPIVACAINE-EPINEPHRINE (PF) 0.25% -1:200000 IJ SOLN
INTRAMUSCULAR | Status: DC | PRN
  Administered 2021-01-24: 30 mL via PERINEURAL

## 2021-01-24 MED ORDER — ROCURONIUM BROMIDE 10 MG/ML (PF) SYRINGE
PREFILLED_SYRINGE | INTRAVENOUS | Status: AC
  Filled 2021-01-24: qty 10

## 2021-01-24 MED ORDER — CELECOXIB 200 MG PO CAPS
ORAL_CAPSULE | ORAL | Status: AC
  Administered 2021-01-24: 200 mg via ORAL
  Filled 2021-01-24: qty 1

## 2021-01-24 MED ORDER — ONDANSETRON HCL 4 MG/2ML IJ SOLN: 4.0000 mg | Freq: Once | INTRAMUSCULAR | Status: DC | PRN

## 2021-01-24 MED ORDER — SUGAMMADEX SODIUM 500 MG/5ML IV SOLN
INTRAVENOUS | Status: AC
  Filled 2021-01-24: qty 5

## 2021-01-24 MED ORDER — ONDANSETRON HCL 4 MG/2ML IJ SOLN
INTRAMUSCULAR | Status: DC | PRN
  Administered 2021-01-24: 4 mg via INTRAVENOUS

## 2021-01-24 MED ORDER — TRAMADOL-ACETAMINOPHEN 37.5-325 MG PO TABS: 1.0000 | ORAL_TABLET | ORAL | 0 refills | Status: DC | PRN

## 2021-01-24 MED ORDER — PHENYLEPHRINE HCL (PRESSORS) 10 MG/ML IV SOLN
INTRAVENOUS | Status: AC
  Filled 2021-01-24: qty 1

## 2021-01-24 MED ORDER — FAMOTIDINE 20 MG PO TABS
ORAL_TABLET | ORAL | Status: AC
  Administered 2021-01-24: 20 mg via ORAL
  Filled 2021-01-24: qty 1

## 2021-01-24 MED ORDER — ACETAMINOPHEN 500 MG PO TABS
ORAL_TABLET | ORAL | Status: AC
  Administered 2021-01-24: 1000 mg via ORAL
  Filled 2021-01-24: qty 2

## 2021-01-24 MED ORDER — DEXMEDETOMIDINE (PRECEDEX) IN NS 20 MCG/5ML (4 MCG/ML) IV SYRINGE
PREFILLED_SYRINGE | INTRAVENOUS | Status: DC | PRN
  Administered 2021-01-24: 8 ug via INTRAVENOUS
  Administered 2021-01-24: 4 ug via INTRAVENOUS
  Administered 2021-01-24: 8 ug via INTRAVENOUS

## 2021-01-24 MED ORDER — PHENYLEPHRINE HCL (PRESSORS) 10 MG/ML IV SOLN
INTRAVENOUS | Status: DC | PRN
  Administered 2021-01-24: 200 ug via INTRAVENOUS
  Administered 2021-01-24 (×3): 100 ug via INTRAVENOUS

## 2021-01-24 MED ORDER — FENTANYL CITRATE (PF) 100 MCG/2ML IJ SOLN
25.0000 ug | INTRAMUSCULAR | Status: DC | PRN
Start: 2021-01-24 — End: 2021-01-24
  Administered 2021-01-24 (×3): 25 ug via INTRAVENOUS

## 2021-01-24 SURGICAL SUPPLY — 53 items
"PENCIL ELECTRO HAND CTR " (MISCELLANEOUS) ×1 IMPLANT
ADH SKN CLS APL DERMABOND .7 (GAUZE/BANDAGES/DRESSINGS) ×1
APL PRP STRL LF DISP 70% ISPRP (MISCELLANEOUS) ×1
BAG SPEC RTRVL LRG 6X4 10 (ENDOMECHANICALS) ×1
CANNULA REDUC XI 12-8 STAPL (CANNULA) ×1
CANNULA REDUCER 12-8 DVNC XI (CANNULA) ×1 IMPLANT
CHLORAPREP W/TINT 26 (MISCELLANEOUS) ×2 IMPLANT
CLIP VESOLOCK MED LG 6/CT (CLIP) ×2 IMPLANT
COVER TIP SHEARS 8 DVNC (MISCELLANEOUS) IMPLANT
COVER TIP SHEARS 8MM DA VINCI (MISCELLANEOUS) ×1
COVER WAND RF STERILE (DRAPES) ×2 IMPLANT
DECANTER SPIKE VIAL GLASS SM (MISCELLANEOUS) ×1 IMPLANT
DEFOGGER SCOPE WARMER CLEARIFY (MISCELLANEOUS) ×2 IMPLANT
DERMABOND ADVANCED (GAUZE/BANDAGES/DRESSINGS) ×1
DERMABOND ADVANCED .7 DNX12 (GAUZE/BANDAGES/DRESSINGS) ×1 IMPLANT
DRAPE ARM DVNC X/XI (DISPOSABLE) ×4 IMPLANT
DRAPE COLUMN DVNC XI (DISPOSABLE) ×1 IMPLANT
DRAPE DA VINCI XI ARM (DISPOSABLE) ×4
DRAPE DA VINCI XI COLUMN (DISPOSABLE) ×1
ELECT CAUTERY BLADE 6.4 (BLADE) ×2 IMPLANT
ELECT REM PT RETURN 9FT ADLT (ELECTROSURGICAL) ×2
ELECTRODE REM PT RTRN 9FT ADLT (ELECTROSURGICAL) ×1 IMPLANT
GLOVE SURG ENC MOIS LTX SZ7 (GLOVE) ×12 IMPLANT
GOWN STRL REUS W/ TWL LRG LVL3 (GOWN DISPOSABLE) ×4 IMPLANT
GOWN STRL REUS W/TWL LRG LVL3 (GOWN DISPOSABLE) ×12
IRRIGATION STRYKERFLOW (MISCELLANEOUS) IMPLANT
IRRIGATOR STRYKERFLOW (MISCELLANEOUS) ×2
IV NS 1000ML (IV SOLUTION) ×2
IV NS 1000ML BAXH (IV SOLUTION) IMPLANT
KIT PINK PAD W/HEAD ARE REST (MISCELLANEOUS) ×2
KIT PINK PAD W/HEAD ARM REST (MISCELLANEOUS) ×1 IMPLANT
LABEL OR SOLS (LABEL) ×2 IMPLANT
MANIFOLD NEPTUNE II (INSTRUMENTS) ×2 IMPLANT
NEEDLE HYPO 22GX1.5 SAFETY (NEEDLE) ×2 IMPLANT
NS IRRIG 500ML POUR BTL (IV SOLUTION) ×2 IMPLANT
OBTURATOR OPTICAL STANDARD 8MM (TROCAR) ×1
OBTURATOR OPTICAL STND 8 DVNC (TROCAR) ×1
OBTURATOR OPTICALSTD 8 DVNC (TROCAR) ×1 IMPLANT
PACK LAP CHOLECYSTECTOMY (MISCELLANEOUS) ×2 IMPLANT
PENCIL ELECTRO HAND CTR (MISCELLANEOUS) ×2 IMPLANT
POUCH SPECIMEN RETRIEVAL 10MM (ENDOMECHANICALS) ×2 IMPLANT
SEAL CANN UNIV 5-8 DVNC XI (MISCELLANEOUS) ×3 IMPLANT
SEAL XI 5MM-8MM UNIVERSAL (MISCELLANEOUS) ×3
SET TUBE SMOKE EVAC HIGH FLOW (TUBING) ×2 IMPLANT
SOLUTION ELECTROLUBE (MISCELLANEOUS) ×2 IMPLANT
SPONGE LAP 18X18 RF (DISPOSABLE) ×2 IMPLANT
SPONGE LAP 4X18 RFD (DISPOSABLE) IMPLANT
STAPLER CANNULA SEAL DVNC XI (STAPLE) ×1 IMPLANT
STAPLER CANNULA SEAL XI (STAPLE) ×1
SUT MNCRL AB 4-0 PS2 18 (SUTURE) ×2 IMPLANT
SUT VICRYL 0 AB UR-6 (SUTURE) ×4 IMPLANT
TAPE TRANSPORE STRL 2 31045 (GAUZE/BANDAGES/DRESSINGS) ×2 IMPLANT
TROCAR BALLN GELPORT 12X130M (ENDOMECHANICALS) ×2 IMPLANT

## 2021-01-24 NOTE — Interval H&P Note (Signed)
History and Physical Interval Note:  01/24/2021 9:25 AM  Jack Shaw  has presented today for surgery, with the diagnosis of biliary colie.  The various methods of treatment have been discussed with the patient and family. After consideration of risks, benefits and other options for treatment, the patient has consented to  Procedure(s): XI ROBOTIC [place] (N/A) as a surgical intervention.  The patient's history has been reviewed, patient examined, no change in status, stable for surgery.  I have reviewed the patient's chart and labs.  Questions were answered to the patient's satisfaction.     [place]

## 2021-01-24 NOTE — Anesthesia Preprocedure Evaluation (Addendum)
Anesthesia Evaluation  Patient identified by MRN, date of birth, ID band Patient awake    Reviewed: Allergy & Precautions, NPO status , Patient's Chart, lab work & pertinent test results  History of Anesthesia Complications Negative for: history of anesthetic complications  Airway Mallampati: II       Dental   Pulmonary asthma (exercise induced, no inhalers) , sleep apnea (not using CPAP) , Not current smoker,  R hemidiaphragm paralysis          Cardiovascular hypertension, Pt. on medications (-) Past MI and (-) CHF (-) dysrhythmias (-) Valvular Problems/Murmurs     Neuro/Psych neg Seizures Anxiety    GI/Hepatic Neg liver ROS, neg GERD  ,  Endo/Other  neg diabetes  Renal/GU Renal InsufficiencyRenal disease     Musculoskeletal   Abdominal   Peds  Hematology   Anesthesia Other Findings   Reproductive/Obstetrics                            Anesthesia Physical Anesthesia Plan  ASA: III  Anesthesia Plan: General   Post-op Pain Management:    Induction: Intravenous  PONV Risk Score and Plan: 2 and Ondansetron and Dexamethasone  Airway Management Planned: Oral ETT  Additional Equipment:   Intra-op Plan:   Post-operative Plan:   Informed Consent: I have reviewed the patients History and Physical, chart, labs and discussed the procedure including the risks, benefits and alternatives for the proposed anesthesia with the patient or authorized representative who has indicated his/her understanding and acceptance.       Plan Discussed with:   Anesthesia Plan Comments:         Anesthesia Quick Evaluation

## 2021-01-24 NOTE — Op Note (Signed)
Robotic assisted laparoscopic Cholecystectomy Robotic lysis of adhesions taking at least 70 minutes of total operative time  Pre-operative Diagnosis: Chronic RUQ pain, bilary colic  Post-operative Diagnosis: same  Procedure:  Robotic assisted laparoscopic Cholecystectomy  Surgeon: Caroleen Hamman, MD FACS  Anesthesia: Gen. with endotracheal tube  Findings: Chronic mild Cholecystitis  Severe and extensive adhesions , SB and omentum plastered to the abdominal wall on the RUQ, LOA requiring at least 70 minutes of total operative time. Challenging case due to severe adhesions Mod 22 placed due to the complexity of the case   Estimated Blood Loss: 15cc       Specimens: Gallbladder           Complications: none   Procedure Details  The patient was seen again in the Holding Room. The benefits, complications, treatment options, and expected outcomes were discussed with the patient. The risks of bleeding, infection, recurrence of symptoms, failure to resolve symptoms, bile duct damage, bile duct leak, retained common bile duct stone, bowel injury, any of which could require further surgery and/or ERCP, stent, or papillotomy were reviewed with the patient. The likelihood of improving the patient's symptoms with return to their baseline status is good.  The patient and/or family concurred with the proposed plan, giving informed consent.  The patient was taken to Operating Room, identified  and the procedure verified as Laparoscopic Cholecystectomy.  A Time Out was held and the above information confirmed.  Prior to the induction of general anesthesia, antibiotic prophylaxis was administered. VTE prophylaxis was in place. General endotracheal anesthesia was then administered and tolerated well. After the induction, the abdomen was prepped with Chloraprep and draped in the sterile fashion. The patient was positioned in the supine position.  Cut down technique was used to enter the abdominal cavity and  a Hasson trochar was placed after two vicryl stitches were anchored to the fascia. Pneumoperitoneum was then created with CO2 and tolerated well without any adverse changes in the patient's vital signs.  Three 8-mm ports were placed under direct vision. All skin incisions  were infiltrated with a local anesthetic agent before making the incision and placing the trocars.   The patient was positioned  in reverse Trendelenburg, robot was brought to the surgical field and docked in the standard fashion.  We made sure all the instrumentation was kept indirect view at all times and that there were no collision between the arms. I scrubbed out and went to the console. There were extensive adhesions, omentum and SB plastered to the RUQ . I spent at least 70 minutes doing LOA, I used meticulous dissection with scissors, avoiding any bowel injuries. After extensive LOA, the gallbladder was able to be  identified, the fundus grasped and retracted cephalad. Adhesions were lysed bluntly. The infundibulum was grasped and retracted laterally, exposing the peritoneum overlying the triangle of Calot. This was then divided and exposed in a blunt fashion. An extended critical view of the cystic duct and cystic artery was obtained.  The cystic duct was clearly identified and bluntly dissected.   Artery and duct were double clipped and divided. Using ICG cholangiography we visualize the cystic duct and so no a Baron biliary ductal anatomy or evidence of bile injuries. The gallbladder was taken from the gallbladder fossa in a retrograde fashion with the electrocautery.  Hemostasis was achieved with the electrocautery. nspection of the right upper quadrant was performed. No bleeding, bile duct injury or leak, or bowel injury was noted. Robotic instruments and robotic arms  were undocked in the standard fashion.  I scrubbed back in.  The gallbladder was removed and placed in an Endocatch bag.   Pneumoperitoneum was released.  The  periumbilical port site was closed with interrumpted 0 Vicryl sutures. 4-0 subcuticular Monocryl was used to close the skin. Dermabond was  applied.  The patient was then extubated and brought to the recovery room in stable condition. Sponge, lap, and needle counts were correct at closure and at the conclusion of the case.               Caroleen Hamman, MD, FACS

## 2021-01-24 NOTE — Discharge Instructions (Addendum)
AMBULATORY SURGERY  DISCHARGE INSTRUCTIONS   1) The drugs that you were given will stay in your system until tomorrow so for the next 24 hours you should not:  A) Drive an automobile B) Make any legal decisions C) Drink any alcoholic beverage   2) You may resume regular meals tomorrow.  Today it is better to start with liquids and gradually work up to solid foods.  You may eat anything you prefer, but it is better to start with liquids, then soup and crackers, and gradually work up to solid foods.   3) Please notify your doctor immediately if you have any unusual bleeding, trouble breathing, redness and pain at the surgery site, drainage, fever, or pain not relieved by medication.    Please contact your physician with any problems or Same Day Surgery at [phone], Monday through Friday 6 am to 4 pm, or [place] at [facility] number at [phone].   DO NOT REMOVE EXPAREL (TEAL BAND) FOR 4 DAYS (96 HOURS)  Information for Discharge Teaching: EXPAREL (bupivacaine liposome injectable suspension)   Your surgeon or anesthesiologist gave you EXPAREL(bupivacaine) to help control your pain after surgery.   EXPAREL is a local anesthetic that provides pain relief by numbing the tissue around the surgical site.  EXPAREL is designed to release pain medication over time and can control pain for up to 72 hours.  Depending on how you respond to EXPAREL, you may require less pain medication during your recovery.  Possible side effects:  Temporary loss of sensation or ability to move in the area where bupivacaine was injected.  Nausea, vomiting, constipation  Rarely, numbness and tingling in your mouth or lips, lightheadedness, or anxiety may occur.  Call your doctor right away if you think you may be experiencing any of these sensations, or if you have other questions regarding possible side effects.  Follow all other discharge instructions given to you by your surgeon or  nurse. Eat a healthy diet and drink plenty of water or other fluids.  If you return to the hospital for any reason within 96 hours following the administration of EXPAREL, it is important for health care providers to know that you have received this anesthetic. A teal colored band has been placed on your arm with the date, time and amount of EXPAREL you have received in order to alert and inform your health care providers. Please leave this armband in place for the full 96 hours following administration, and then you may remove the band.

## 2021-01-24 NOTE — OR PostOp (Incomplete)
PACU TO INPATIENT HANDOFF REPORT  Name/Age/Gender Warden Fillers 60 y.o. male  Code Status   Home/SNF/Other {Discharge Destination:18313::"Home"}  Chief Complaint biliary colie  Level of Care/Admitting Diagnosis ED Disposition    None      Medical History Past Medical History:  Diagnosis Date  . Asthma   . BPH (benign prostatic hyperplasia)   . Chronic kidney disease   . Dizziness   . History of kidney stones   . Hyperlipidemia   . Hypertension   . NAION (non-arteritic anterior ischemic optic neuropathy), right   . Pneumonia   . Shortness of breath   . Sleep apnea   . Tinnitus   . Vascular spasm ([place])   . Weight loss     Allergies Allergies  Allergen Reactions  . Codeine Itching    Other reaction(s): psychological reaction, Unable to Obtain  . Hydrocodone Itching and Rash  . Oxycodone Itching and Rash  . Diphenoxylate-Atropine Rash    Diffuse with persistent itching    IV Location/Drains/Wounds Patient Lines/Drains/Airways Status    Active Line/Drains/Airways    Name Placement date Placement time Site Days   Peripheral IV 01/24/21 Left;Posterior Hand 01/24/21  0914  Hand  less than 1   Peripheral IV 01/24/21 Anterior;Right Forearm 01/24/21  0918  Forearm  less than 1   Incision (Closed) 01/24/21 Abdomen 01/24/21  1224  - less than 1          Labs/Imaging Results for orders placed or performed during the hospital encounter of 01/23/21 (from the past 48 hour(s))  Basic metabolic panel per protocol     Status: Abnormal   Collection Time: 01/23/21 10:27 AM  Result Value Ref Range   Sodium 140 135 - 145 mmol/L   Potassium 3.9 3.5 - 5.1 mmol/L   Chloride 106 98 - 111 mmol/L   CO2 26 22 - 32 mmol/L   Glucose, Bld 112 (H) 70 - 99 mg/dL    Comment: Glucose reference range applies only to samples taken after fasting for at least 8 hours.   BUN 12 8 - 23 mg/dL   Creatinine, Ser 1.18 0.61 - 1.24 mg/dL   Calcium 9.1 8.9 - 10.3 mg/dL   GFR, Estimated  >60 >60 mL/min    Comment: (NOTE) Calculated using the CKD-EPI Creatinine Equation (2021)    Anion gap 8 5 - 15    Comment: Performed at [facility], [address] [place],  [address]  CBC per protocol     Status: None   Collection Time: 01/23/21 10:27 AM  Result Value Ref Range   WBC 7.3 4.0 - 10.5 K/uL   RBC 4.64 4.22 - 5.81 MIL/uL   Hemoglobin 14.6 13.0 - 17.0 g/dL   HCT 42.9 39.0 - 52.0 %   MCV 92.5 80.0 - 100.0 fL   MCH 31.5 26.0 - 34.0 pg   MCHC 34.0 30.0 - 36.0 g/dL   RDW 12.6 11.5 - 15.5 %   Platelets 196 150 - 400 K/uL   nRBC 0.0 0.0 - 0.2 %    Comment: Performed at [facility], [address] [place], [place] [address]  SARS CORONAVIRUS 2 (TAT 6-24 HRS) Nasopharyngeal Nasopharyngeal Swab     Status: None   Collection Time: 01/23/21 10:46 AM   Specimen: Nasopharyngeal Swab  Result Value Ref Range   SARS Coronavirus 2 NEGATIVE NEGATIVE    Comment: (NOTE) SARS-CoV-2 target nucleic acids are NOT DETECTED.  The SARS-CoV-2 RNA is generally detectable in upper  and lower respiratory specimens during the acute phase of infection. Negative results do not preclude SARS-CoV-2 infection, do not rule out co-infections with other pathogens, and should not be used as the sole basis for treatment or other patient management decisions. Negative results must be combined with clinical observations, patient history, and epidemiological information. The expected result is Negative.  Fact Sheet for Patients: SugarRoll.be  Fact Sheet for Healthcare Providers: https://www.woods-mathews.com/  This test is not yet approved or cleared by the [place] FDA and  has been authorized for detection and/or diagnosis of SARS-CoV-2 by FDA under an Emergency Use Authorization (EUA). This EUA will remain  in effect (meaning this test can be used) for the duration of the COVID-19 declaration under Se ction 564(b)(1) of the  Act, 21 U.S.C. section 360bbb-3(b)(1), unless the authorization is terminated or revoked sooner.  Performed at [facility], [place] [address], [place],  [address]    No results found.  Pending Labs   Vitals/Pain Today's Vitals   01/24/21 1319 01/24/21 1333 01/24/21 1343 01/24/21 1400  BP:  139/69 138/78 127/83  Pulse: 85 85 86 85  Resp: 16 17 19 14   Temp:  97.6 F (36.4 C)  97.7 F (36.5 C)  TempSrc:      SpO2: 97% 99% 100% 98%  Weight:      Height:      PainSc: 7  8   3      Isolation Precautions @ISOLATION @  Administered Medications Periop Administered Meds from 01/24/2021 0853 to 01/24/2021 1408      Date/Time Order Dose Route Action Action by Comments    01/24/2021 0909 acetaminophen (TYLENOL) tablet 1,000 mg 1,000 mg Oral Given Fredderick Erb, RN     01/24/2021 1205 bupivacaine liposome (EXPAREL 1.3%) in normal saline Optime 20 mL Infiltration Canceled Entry Pabon, [place] F, MD     01/24/2021 1206 bupivacaine liposome (EXPAREL) 1.3 % injection 20 mL Infiltration Given Pabon, [place] F, MD     01/24/2021 1205 bupivacaine-epinephrine (MARCAINE W/ EPI) 0.25% -1:200000 injection 30 mL Peri-NEURAL Given Jules Husbands, MD     01/24/2021 1014 ceFAZolin (ANCEF) 2-4 GM/100ML-% IVPB    Override pull for Anesthesia Jonna Clark, CRNA Filed by anesthesia medication administration from clinical order [id number]    01/24/2021 1014 ceFAZolin (ANCEF) IVPB 2g/100 mL premix 2 g Intravenous Given Jonna Clark, CRNA     01/24/2021 0910 celecoxib (CELEBREX) capsule 200 mg 200 mg Oral Given Fredderick Erb, RN     01/24/2021 0911 chlorhexidine (PERIDEX) 0.12 % solution 15 mL 15 mL Mouth/Throat Given Fredderick Erb, RN     01/24/2021 1038 dexamethasone (DECADRON) injection 10 mg Intravenous Given Jonna Clark, CRNA     01/24/2021 1221 dexmedetomidine (PRECEDEX) in NS 20 mcg/34mL (4 mcg/mL) IV Syringe 8 mcg Intravenous Given Jonna Clark, CRNA     01/24/2021 1145  dexmedetomidine (PRECEDEX) in NS 20 mcg/20mL (4 mcg/mL) IV Syringe 4 mcg Intravenous Given Jonna Clark, CRNA     01/24/2021 0944 dexmedetomidine (PRECEDEX) in NS 20 mcg/62mL (4 mcg/mL) IV Syringe 8 mcg Intravenous Given Jonna Clark, CRNA     01/24/2021 0910 famotidine (PEPCID) tablet 20 mg 20 mg Oral Given Fredderick Erb, RN     01/24/2021 1236 fentaNYL (SUBLIMAZE) injection 50 mcg Intravenous Given Jonna Clark, CRNA     01/24/2021 1223 fentaNYL (SUBLIMAZE) injection 50 mcg Intravenous Given Jonna Clark, CRNA     01/24/2021 1024 fentaNYL (SUBLIMAZE) injection 50 mcg Intravenous Given Jonna Clark, CRNA  01/24/2021 0957 fentaNYL (SUBLIMAZE) injection 50 mcg Intravenous Given Jonna Clark, CRNA     01/24/2021 1318 fentaNYL (SUBLIMAZE) injection 25 mcg 25 mcg Intravenous Given by Mariel Kansky, RN     01/24/2021 1309 fentaNYL (SUBLIMAZE) injection 25 mcg 25 mcg Intravenous Given by Mariel Kansky, RN     01/24/2021 1300 fentaNYL (SUBLIMAZE) injection 25 mcg 25 mcg Intravenous Given by Mariel Kansky, RN     01/24/2021 1254 fentaNYL (SUBLIMAZE) injection 25 mcg 25 mcg Intravenous Given Beatris Ship, RN     01/24/2021 8338 gabapentin (NEURONTIN) capsule 300 mg 300 mg Oral Given Fredderick Erb, RN     01/24/2021 0915 indocyanine green (IC-GREEN) injection 5 mg 5 mg Intravenous Given Fredderick Erb, RN     01/24/2021 1201 lactated ringers infusion   Intravenous Anesthesia Volume Adjustment Jonna Clark, CRNA     01/24/2021 0944 lactated ringers infusion   Intravenous Restarted Jonna Clark, CRNA     01/24/2021 2505 lactated ringers infusion   Intravenous Wynona Dove, CRNA Switch to gravity    01/24/2021 0915 lactated ringers infusion   Intravenous New Bag/Given Fredderick Erb, RN     01/24/2021 0956 lidocaine (cardiac) 100 mg/63mL (XYLOCAINE) injection 2% 60 mg Intravenous Given Jonna Clark, CRNA     01/24/2021 0911 MEDLINE  mouth rinse   Mouth Rinse See Alternative Fredderick Erb, RN     01/24/2021 1204 ondansetron (ZOFRAN) injection 4 mg Intravenous Given Jonna Clark, CRNA     01/24/2021 1211 phenylephrine (NEO-SYNEPHRINE) 100 mcg/mL in sodium chloride 0.9 % 100 mL infusion 0 mcg/min Intravenous Jeremy Johann, CRNA     01/24/2021 1155 phenylephrine (NEO-SYNEPHRINE) 100 mcg/mL in sodium chloride 0.9 % 100 mL infusion 15 mcg/min Intravenous Rate/Dose Change Jonna Clark, CRNA     01/24/2021 1145 phenylephrine (NEO-SYNEPHRINE) 100 mcg/mL in sodium chloride 0.9 % 100 mL infusion 10 mcg/min Intravenous Rate/Dose Change Jonna Clark, CRNA     01/24/2021 1134 phenylephrine (NEO-SYNEPHRINE) 100 mcg/mL in sodium chloride 0.9 % 100 mL infusion 25 mcg/min Intravenous Rate/Dose Change Jonna Clark, CRNA     01/24/2021 1125 phenylephrine (NEO-SYNEPHRINE) 100 mcg/mL in sodium chloride 0.9 % 100 mL infusion 50 mcg/min Intravenous Rate/Dose Change Jonna Clark, CRNA     01/24/2021 1119 phenylephrine (NEO-SYNEPHRINE) 100 mcg/mL in sodium chloride 0.9 % 100 mL infusion 25 mcg/min Intravenous New Bag/Given Jonna Clark, CRNA     01/24/2021 1122 phenylephrine (NEO-SYNEPHRINE) injection 100 mcg Intravenous Given Jonna Clark, CRNA     01/24/2021 1119 phenylephrine (NEO-SYNEPHRINE) injection 100 mcg Intravenous Given Jonna Clark, CRNA     01/24/2021 1038 phenylephrine (NEO-SYNEPHRINE) injection 200 mcg Intravenous Given Jonna Clark, CRNA     01/24/2021 1007 phenylephrine (NEO-SYNEPHRINE) injection 100 mcg Intravenous Given Jonna Clark, CRNA     01/24/2021 1220 propofol (DIPRIVAN) 10 mg/mL bolus/IV push 50 mg Intravenous Given Jonna Clark, CRNA     01/24/2021 0958 propofol (DIPRIVAN) 10 mg/mL bolus/IV push 200 mg Intravenous Given Jonna Clark, CRNA     01/24/2021 1101 rocuronium (ZEMURON) injection 30 mg Intravenous Given Jonna Clark, CRNA     01/24/2021 0959  rocuronium (ZEMURON) injection 50 mg Intravenous Given Jonna Clark, CRNA     01/24/2021 1223 sugammadex sodium (BRIDION) injection 300 mg Intravenous Given Jonna Clark, CRNA       Mobility {Mobility:20148}

## 2021-01-24 NOTE — Anesthesia Procedure Notes (Signed)
Procedure Name: Intubation Date/Time: 01/24/2021 10:00 AM Performed by: Jonna Clark, CRNA Pre-anesthesia Checklist: Patient identified, Patient being monitored, Timeout performed, Emergency Drugs available and Suction available Patient Re-evaluated:Patient Re-evaluated prior to induction Oxygen Delivery Method: Circle system utilized Preoxygenation: Pre-oxygenation with 100% oxygen Induction Type: IV induction Ventilation: Mask ventilation without difficulty Laryngoscope Size: Mac and 4 Grade View: Grade II Tube type: Oral Tube size: 7.5 mm Number of attempts: 1 Airway Equipment and Method: Stylet Placement Confirmation: ETT inserted through vocal cords under direct vision,  positive ETCO2 and breath sounds checked- equal and bilateral Secured at: 21 cm Tube secured with: Tape Dental Injury: Teeth and Oropharynx as per pre-operative assessment

## 2021-01-24 NOTE — Transfer of Care (Signed)
Immediate Anesthesia Transfer of Care Note  Patient: Jack Shaw  Procedure(s) Performed: XI ROBOTIC ASSISTED LAPAROSCOPIC CHOLECYSTECTOMY (N/A )  Patient Location: PACU  Anesthesia Type:General  Level of Consciousness: awake, alert  and oriented  Airway & Oxygen Therapy: Patient Spontanous Breathing and Patient connected to face mask oxygen  Post-op Assessment: Report given to RN and Post -op Vital signs reviewed and stable  Post vital signs: Reviewed and stable  Last Vitals:  Vitals Value Taken Time  BP 121/62 01/24/21 1233  Temp    Pulse 87 01/24/21 1236  Resp 12 01/24/21 1236  SpO2 100 % 01/24/21 1236  Vitals shown include unvalidated device data.  Last Pain:  Vitals:   01/24/21 0857  TempSrc: Oral  PainSc: 4          Complications: No complications documented.

## 2021-01-25 NOTE — Anesthesia Postprocedure Evaluation (Signed)
Anesthesia Post Note  Patient: Jack Shaw  Procedure(s) Performed: XI ROBOTIC ASSISTED LAPAROSCOPIC CHOLECYSTECTOMY (N/A )  Patient location during evaluation: PACU Anesthesia Type: General Level of consciousness: awake and alert Pain management: pain level controlled Vital Signs Assessment: post-procedure vital signs reviewed and stable Respiratory status: spontaneous breathing and respiratory function stable Cardiovascular status: stable Anesthetic complications: no   No complications documented.   Last Vitals:  Vitals:   01/24/21 1428 01/24/21 1435  BP: 128/82 (!) 147/84  Pulse: 85 88  Resp: (!) 23 16  Temp:  36.4 C  SpO2: 100% 95%    Last Pain:  Vitals:   01/24/21 1435  TempSrc: Oral  PainSc: 5                  Brysun Eschmann K

## 2021-01-26 ENCOUNTER — Emergency Department: Payer: BLUE CROSS/BLUE SHIELD

## 2021-01-26 ENCOUNTER — Inpatient Hospital Stay
Admission: EM | Admit: 2021-01-26 | Discharge: 2021-01-31 | DRG: 862 | Disposition: A | Discharge status: Home / Self Care | Payer: BLUE CROSS/BLUE SHIELD | Attending: Internal Medicine | Admitting: Internal Medicine

## 2021-01-26 ENCOUNTER — Other Ambulatory Visit: Payer: Self-pay

## 2021-01-26 DIAGNOSIS — Z885 Allergy status to narcotic agent status: Secondary | ICD-10-CM | POA: Diagnosis not present

## 2021-01-26 DIAGNOSIS — N179 Acute kidney failure, unspecified: Secondary | ICD-10-CM | POA: Diagnosis present

## 2021-01-26 DIAGNOSIS — I10 Essential (primary) hypertension: Secondary | ICD-10-CM | POA: Diagnosis present

## 2021-01-26 DIAGNOSIS — R1084 Generalized abdominal pain: Secondary | ICD-10-CM

## 2021-01-26 DIAGNOSIS — G8918 Other acute postprocedural pain: Secondary | ICD-10-CM | POA: Diagnosis not present

## 2021-01-26 DIAGNOSIS — E860 Dehydration: Secondary | ICD-10-CM | POA: Diagnosis present

## 2021-01-26 DIAGNOSIS — H47011 Ischemic optic neuropathy, right eye: Secondary | ICD-10-CM | POA: Diagnosis present

## 2021-01-26 DIAGNOSIS — T8144XA Sepsis following a procedure, initial encounter: Principal | ICD-10-CM | POA: Diagnosis present

## 2021-01-26 DIAGNOSIS — E785 Hyperlipidemia, unspecified: Secondary | ICD-10-CM | POA: Diagnosis present

## 2021-01-26 DIAGNOSIS — G4733 Obstructive sleep apnea (adult) (pediatric): Secondary | ICD-10-CM | POA: Diagnosis present

## 2021-01-26 DIAGNOSIS — R6520 Severe sepsis without septic shock: Secondary | ICD-10-CM | POA: Diagnosis present

## 2021-01-26 DIAGNOSIS — I2510 Atherosclerotic heart disease of native coronary artery without angina pectoris: Secondary | ICD-10-CM | POA: Diagnosis present

## 2021-01-26 DIAGNOSIS — J986 Disorders of diaphragm: Secondary | ICD-10-CM | POA: Diagnosis present

## 2021-01-26 DIAGNOSIS — Z20822 Contact with and (suspected) exposure to covid-19: Secondary | ICD-10-CM | POA: Diagnosis present

## 2021-01-26 DIAGNOSIS — Z7982 Long term (current) use of aspirin: Secondary | ICD-10-CM | POA: Diagnosis not present

## 2021-01-26 DIAGNOSIS — Y838 Other surgical procedures as the cause of abnormal reaction of the patient, or of later complication, without mention of misadventure at the time of the procedure: Secondary | ICD-10-CM | POA: Diagnosis present

## 2021-01-26 DIAGNOSIS — K811 Chronic cholecystitis: Secondary | ICD-10-CM | POA: Diagnosis present

## 2021-01-26 DIAGNOSIS — J45909 Unspecified asthma, uncomplicated: Secondary | ICD-10-CM | POA: Diagnosis present

## 2021-01-26 DIAGNOSIS — E669 Obesity, unspecified: Secondary | ICD-10-CM | POA: Diagnosis present

## 2021-01-26 DIAGNOSIS — E872 Acidosis: Secondary | ICD-10-CM | POA: Diagnosis present

## 2021-01-26 DIAGNOSIS — K839 Disease of biliary tract, unspecified: Secondary | ICD-10-CM | POA: Diagnosis not present

## 2021-01-26 DIAGNOSIS — M25512 Pain in left shoulder: Secondary | ICD-10-CM | POA: Diagnosis present

## 2021-01-26 DIAGNOSIS — K567 Ileus, unspecified: Secondary | ICD-10-CM | POA: Diagnosis present

## 2021-01-26 DIAGNOSIS — Q445 Other congenital malformations of bile ducts: Secondary | ICD-10-CM

## 2021-01-26 DIAGNOSIS — Z9049 Acquired absence of other specified parts of digestive tract: Secondary | ICD-10-CM

## 2021-01-26 DIAGNOSIS — Z79899 Other long term (current) drug therapy: Secondary | ICD-10-CM

## 2021-01-26 DIAGNOSIS — Z888 Allergy status to other drugs, medicaments and biological substances status: Secondary | ICD-10-CM | POA: Diagnosis not present

## 2021-01-26 DIAGNOSIS — N182 Chronic kidney disease, stage 2 (mild): Secondary | ICD-10-CM | POA: Diagnosis present

## 2021-01-26 DIAGNOSIS — R17 Unspecified jaundice: Secondary | ICD-10-CM

## 2021-01-26 DIAGNOSIS — K838 Other specified diseases of biliary tract: Secondary | ICD-10-CM | POA: Diagnosis not present

## 2021-01-26 DIAGNOSIS — A419 Sepsis, unspecified organism: Secondary | ICD-10-CM

## 2021-01-26 DIAGNOSIS — I129 Hypertensive chronic kidney disease with stage 1 through stage 4 chronic kidney disease, or unspecified chronic kidney disease: Secondary | ICD-10-CM | POA: Diagnosis present

## 2021-01-26 DIAGNOSIS — Z6833 Body mass index (BMI) 33.0-33.9, adult: Secondary | ICD-10-CM

## 2021-01-26 DIAGNOSIS — N400 Enlarged prostate without lower urinary tract symptoms: Secondary | ICD-10-CM | POA: Diagnosis present

## 2021-01-26 DIAGNOSIS — K660 Peritoneal adhesions (postprocedural) (postinfection): Secondary | ICD-10-CM | POA: Diagnosis present

## 2021-01-26 DIAGNOSIS — K9189 Other postprocedural complications and disorders of digestive system: Secondary | ICD-10-CM | POA: Diagnosis present

## 2021-01-26 DIAGNOSIS — F419 Anxiety disorder, unspecified: Secondary | ICD-10-CM | POA: Diagnosis present

## 2021-01-26 DIAGNOSIS — G8929 Other chronic pain: Secondary | ICD-10-CM | POA: Diagnosis present

## 2021-01-26 DIAGNOSIS — G4730 Sleep apnea, unspecified: Secondary | ICD-10-CM | POA: Diagnosis present

## 2021-01-26 LAB — COMPREHENSIVE METABOLIC PANEL
ALT: 75 U/L — ABNORMAL HIGH (ref 0–44)
AST: 49 U/L — ABNORMAL HIGH (ref 15–41)
Albumin: 3.7 g/dL (ref 3.5–5.0)
Alkaline Phosphatase: 33 U/L — ABNORMAL LOW (ref 38–126)
Anion gap: 9 (ref 5–15)
BUN: 14 mg/dL (ref 8–23)
CO2: 26 mmol/L (ref 22–32)
Calcium: 9 mg/dL (ref 8.9–10.3)
Chloride: 103 mmol/L (ref 98–111)
Creatinine, Ser: 1.13 mg/dL (ref 0.61–1.24)
GFR, Estimated: >60 mL/min (ref >60)
Glucose, Bld: 148 mg/dL — ABNORMAL HIGH (ref 70–99)
Potassium: 4 mmol/L (ref 3.5–5.1)
Sodium: 138 mmol/L (ref 135–145)
Total Bilirubin: 3.3 mg/dL — ABNORMAL HIGH (ref 0.3–1.2)
Total Protein: 7.2 g/dL (ref 6.5–8.1)

## 2021-01-26 LAB — URINALYSIS, COMPLETE (UACMP) WITH MICROSCOPIC
Bilirubin Urine: NEGATIVE
Glucose, UA: NEGATIVE mg/dL
Ketones, ur: NEGATIVE mg/dL
Leukocytes,Ua: NEGATIVE
Nitrite: NEGATIVE
Protein, ur: 100 mg/dL — AB
Specific Gravity, Urine: 1.03 (ref 1.005–1.030)
pH: 5 (ref 5.0–8.0)

## 2021-01-26 LAB — RESP PANEL BY RT-PCR (FLU A&B, COVID) ARPGX2
Influenza A by PCR: NEGATIVE
Influenza B by PCR: NEGATIVE
SARS Coronavirus 2 by RT PCR: NEGATIVE

## 2021-01-26 LAB — LACTIC ACID, PLASMA
Lactic Acid, Venous: 2.1 mmol/L (ref 0.5–1.9)
Lactic Acid, Venous: 1.7 mmol/L (ref 0.5–1.9)

## 2021-01-26 LAB — CBC
HCT: 46.1 % (ref 39.0–52.0)
Hemoglobin: 15 g/dL (ref 13.0–17.0)
MCH: 31.2 pg (ref 26.0–34.0)
MCHC: 32.5 g/dL (ref 30.0–36.0)
MCV: 95.8 fL (ref 80.0–100.0)
Platelets: 211 10*3/uL (ref 150–400)
RBC: 4.81 MIL/uL (ref 4.22–5.81)
RDW: 13.1 % (ref 11.5–15.5)
WBC: 17.3 10*3/uL — ABNORMAL HIGH (ref 4.0–10.5)
nRBC: 0 % (ref 0.0–0.2)

## 2021-01-26 LAB — LIPASE, BLOOD: Lipase: 23 U/L (ref 11–51)

## 2021-01-26 MED ORDER — ENOXAPARIN SODIUM 60 MG/0.6ML IJ SOSY
0.5000 mg/kg | PREFILLED_SYRINGE | INTRAMUSCULAR | Status: DC
  Administered 2021-01-27: 55 mg via SUBCUTANEOUS
  Filled 2021-01-26: qty 0.6

## 2021-01-26 MED ORDER — LACTATED RINGERS IV BOLUS
1000.0000 mL | Freq: Once | INTRAVENOUS | Status: AC
  Administered 2021-01-26: 1000 mL via INTRAVENOUS

## 2021-01-26 MED ORDER — IOHEXOL 300 MG/ML  SOLN
100.0000 mL | Freq: Once | INTRAMUSCULAR | Status: AC | PRN
  Administered 2021-01-26: 100 mL via INTRAVENOUS

## 2021-01-26 MED ORDER — ONDANSETRON HCL 4 MG PO TABS: 4.0000 mg | ORAL_TABLET | Freq: Four times a day (QID) | ORAL | Status: DC | PRN

## 2021-01-26 MED ORDER — ONDANSETRON HCL 4 MG/2ML IJ SOLN: 4.0000 mg | Freq: Four times a day (QID) | INTRAMUSCULAR | Status: DC | PRN

## 2021-01-26 MED ORDER — ONDANSETRON 4 MG PO TBDP
4.0000 mg | ORAL_TABLET | Freq: Once | ORAL | Status: AC | PRN
  Administered 2021-01-26: 4 mg via ORAL
  Filled 2021-01-26: qty 1

## 2021-01-26 MED ORDER — KETOROLAC TROMETHAMINE 30 MG/ML IJ SOLN
30.0000 mg | Freq: Three times a day (TID) | INTRAMUSCULAR | Status: DC | PRN
  Administered 2021-01-26 – 2021-01-27 (×2): 30 mg via INTRAVENOUS
  Filled 2021-01-26 (×3): qty 1

## 2021-01-26 MED ORDER — HYDRALAZINE HCL 20 MG/ML IJ SOLN: 5.0000 mg | Freq: Four times a day (QID) | INTRAMUSCULAR | Status: DC | PRN

## 2021-01-26 MED ORDER — LACTATED RINGERS IV SOLN: INTRAVENOUS | Status: DC

## 2021-01-26 MED ORDER — HYDROMORPHONE HCL 1 MG/ML IJ SOLN
0.5000 mg | Freq: Once | INTRAMUSCULAR | Status: AC
  Administered 2021-01-26: 0.5 mg via INTRAVENOUS
  Filled 2021-01-26: qty 1

## 2021-01-26 MED ORDER — HYDROCODONE-ACETAMINOPHEN 5-325 MG PO TABS
1.0000 | ORAL_TABLET | ORAL | Status: DC | PRN
  Administered 2021-01-30: 2 via ORAL
  Filled 2021-01-26: qty 2

## 2021-01-26 MED ORDER — MORPHINE SULFATE (PF) 4 MG/ML IV SOLN
4.0000 mg | Freq: Once | INTRAVENOUS | Status: AC
  Administered 2021-01-26: 4 mg via INTRAVENOUS
  Filled 2021-01-26: qty 1

## 2021-01-26 MED ORDER — PIPERACILLIN-TAZOBACTAM 3.375 G IVPB 30 MIN
3.3750 g | Freq: Once | INTRAVENOUS | Status: AC
  Administered 2021-01-26: 3.375 g via INTRAVENOUS
  Filled 2021-01-26: qty 50

## 2021-01-26 MED ORDER — ACETAMINOPHEN 325 MG PO TABS: 650.0000 mg | ORAL_TABLET | Freq: Four times a day (QID) | ORAL | Status: DC | PRN

## 2021-01-26 MED ORDER — ACETAMINOPHEN 650 MG RE SUPP: 650.0000 mg | Freq: Four times a day (QID) | RECTAL | Status: DC | PRN

## 2021-01-26 MED ORDER — HYDROMORPHONE HCL 1 MG/ML IJ SOLN
1.0000 mg | INTRAMUSCULAR | Status: DC | PRN
  Administered 2021-01-26 – 2021-01-30 (×6): 1 mg via INTRAVENOUS
  Filled 2021-01-26 (×6): qty 1

## 2021-01-26 MED ORDER — ONDANSETRON HCL 4 MG/2ML IJ SOLN
4.0000 mg | Freq: Once | INTRAMUSCULAR | Status: AC
  Administered 2021-01-26: 4 mg via INTRAVENOUS
  Filled 2021-01-26: qty 2

## 2021-01-26 NOTE — ED Notes (Signed)
Dr. Charna Archer notified patient Lactic Acid 2.1. Awaiting orders.
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
## 2021-01-26 NOTE — ED Notes (Addendum)
Patient presents with chief complaint of diffused abdominal pain that radiates to left shoulder since cholecystectomy this past Thursday. Has been taking Tramadol for pain with minimal improvement in pain. No BM since before surgery, + flatulence over the last few days. Developed nausea today, no vomiting. Denies fever/CP/SOB.

## 2021-01-26 NOTE — ED Triage Notes (Signed)
Patient reports having a cholecystectomy on Thursday. Patient c/o upper abdominal pain, constipation, nausea and inability to pass gas since procedure. Patient reports last BM was Wednesday before the procedure.
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
## 2021-01-26 NOTE — Progress Notes (Signed)
Anticoagulation monitoring(Lovenox):  59 yo male ordered Lovenox 40 mg Q24h  Filed Weights   01/26/21 1926  Weight: 108.9 kg (240 lb)   BMI 32.55    Lab Results  Component Value Date   CREATININE 1.13 01/26/2021   CREATININE 1.18 01/23/2021   CREATININE 1.10 01/15/2021   Estimated Creatinine Clearance: 87.5 mL/min (by C-G formula based on SCr of 1.13 mg/dL). Hemoglobin & Hematocrit     Component Value Date/Time   HGB 15.0 01/26/2021 1928   HCT 46.1 01/26/2021 1928     Per Protocol for Patient with estCrcl > 30 ml/min and BMI > 30, will transition to Lovenox 55 mg Q24h.

## 2021-01-27 ENCOUNTER — Inpatient Hospital Stay: Payer: BLUE CROSS/BLUE SHIELD

## 2021-01-27 DIAGNOSIS — G8918 Other acute postprocedural pain: Secondary | ICD-10-CM | POA: Diagnosis not present

## 2021-01-27 LAB — COMPREHENSIVE METABOLIC PANEL
ALT: 49 U/L — ABNORMAL HIGH (ref 0–44)
AST: 29 U/L (ref 15–41)
Albumin: 2.9 g/dL — ABNORMAL LOW (ref 3.5–5.0)
Alkaline Phosphatase: 31 U/L — ABNORMAL LOW (ref 38–126)
Anion gap: 7 (ref 5–15)
BUN: 18 mg/dL (ref 8–23)
CO2: 27 mmol/L (ref 22–32)
Calcium: 8.5 mg/dL — ABNORMAL LOW (ref 8.9–10.3)
Chloride: 104 mmol/L (ref 98–111)
Creatinine, Ser: 1.3 mg/dL — ABNORMAL HIGH (ref 0.61–1.24)
GFR, Estimated: >60 mL/min (ref >60)
Glucose, Bld: 111 mg/dL — ABNORMAL HIGH (ref 70–99)
Potassium: 4.2 mmol/L (ref 3.5–5.1)
Sodium: 138 mmol/L (ref 135–145)
Total Bilirubin: 3.3 mg/dL — ABNORMAL HIGH (ref 0.3–1.2)
Total Protein: 5.9 g/dL — ABNORMAL LOW (ref 6.5–8.1)

## 2021-01-27 LAB — BILIRUBIN, DIRECT: Bilirubin, Direct: 1.6 mg/dL — ABNORMAL HIGH (ref 0.0–0.2)

## 2021-01-27 LAB — CBC
HCT: 39.6 % (ref 39.0–52.0)
Hemoglobin: 12.8 g/dL — ABNORMAL LOW (ref 13.0–17.0)
MCH: 31.2 pg (ref 26.0–34.0)
MCHC: 32.3 g/dL (ref 30.0–36.0)
MCV: 96.6 fL (ref 80.0–100.0)
Platelets: 175 10*3/uL (ref 150–400)
RBC: 4.1 MIL/uL — ABNORMAL LOW (ref 4.22–5.81)
RDW: 13.3 % (ref 11.5–15.5)
WBC: 12.4 10*3/uL — ABNORMAL HIGH (ref 4.0–10.5)
nRBC: 0 % (ref 0.0–0.2)

## 2021-01-27 LAB — HIV ANTIBODY (ROUTINE TESTING W REFLEX): HIV Screen 4th Generation wRfx: NONREACTIVE

## 2021-01-27 LAB — PROTIME-INR
INR: 1.2 (ref 0.8–1.2)
Prothrombin Time: 15.1 seconds (ref 11.4–15.2)

## 2021-01-27 LAB — LACTIC ACID, PLASMA
Lactic Acid, Venous: 1.2 mmol/L (ref 0.5–1.9)
Lactic Acid, Venous: 2.1 mmol/L (ref 0.5–1.9)

## 2021-01-27 LAB — APTT: aPTT: 34 seconds (ref 24–36)

## 2021-01-27 MED ORDER — ASPIRIN EC 81 MG PO TBEC
81.0000 mg | DELAYED_RELEASE_TABLET | Freq: Every day | ORAL | Status: DC
  Administered 2021-01-27 – 2021-01-31 (×5): 81 mg via ORAL
  Filled 2021-01-27 (×5): qty 1

## 2021-01-27 MED ORDER — POLYETHYLENE GLYCOL 3350 17 G PO PACK
17.0000 g | PACK | Freq: Two times a day (BID) | ORAL | Status: DC
  Administered 2021-01-27 (×2): 17 g via ORAL
  Filled 2021-01-27 (×5): qty 1

## 2021-01-27 MED ORDER — ALPRAZOLAM 0.5 MG PO TABS
1.0000 mg | ORAL_TABLET | Freq: Once | ORAL | Status: AC
  Administered 2021-01-27: 1 mg via ORAL
  Filled 2021-01-27: qty 2

## 2021-01-27 MED ORDER — KETOROLAC TROMETHAMINE 30 MG/ML IJ SOLN
30.0000 mg | Freq: Four times a day (QID) | INTRAMUSCULAR | Status: DC | PRN
Start: 2021-01-27 — End: 2021-01-28
  Administered 2021-01-27 – 2021-01-28 (×3): 30 mg via INTRAVENOUS
  Filled 2021-01-27 (×2): qty 1

## 2021-01-27 MED ORDER — ATORVASTATIN CALCIUM 20 MG PO TABS
80.0000 mg | ORAL_TABLET | Freq: Every day | ORAL | Status: DC
  Administered 2021-01-27 – 2021-01-30 (×4): 80 mg via ORAL
  Filled 2021-01-27 (×4): qty 4

## 2021-01-27 MED ORDER — PIPERACILLIN-TAZOBACTAM 3.375 G IVPB
3.3750 g | Freq: Three times a day (TID) | INTRAVENOUS | Status: DC
  Administered 2021-01-27 – 2021-01-31 (×13): 3.375 g via INTRAVENOUS
  Filled 2021-01-27 (×13): qty 50

## 2021-01-27 MED ORDER — NITROGLYCERIN 0.4 MG SL SUBL: 0.4000 mg | SUBLINGUAL_TABLET | SUBLINGUAL | Status: DC | PRN

## 2021-01-27 NOTE — Progress Notes (Addendum)
Patient ID: Jack Shaw, male   DOB: 1961/05/24, 60 y.o.   MRN: [id number]     [place] [facility] Day(s): 1.   Post op day(s):  Marland Kitchen   Interval History: Patient reevaluated this afternoon.  Patient reports feeling a little bit better.  He still reported not passing gas.  He still with discomfort in the abdomen.  Shoulder pain improving.  Denies any nausea or vomiting.  Vital signs in last 24 hours: [min-max] current  Temp:  [98.5 F (36.9 C)-100.4 F (38 C)] 98.5 F (36.9 C) (05/01 1503) Pulse Rate:  [94-139] 101 (05/01 1503) Resp:  [16-28] 16 (05/01 1503) BP: (113-140)/(65-94) 134/93 (05/01 1503) SpO2:  [91 %-100 %] 97 % (05/01 1503) Weight:  [108.9 kg-112.2 kg] 112.2 kg (05/01 0021)     Height: 6' (182.9 cm) Weight: 112.2 kg BMI (Calculated): 33.54   Physical Exam:  Constitutional: alert, cooperative and no distress  Gastrointestinal: soft, mild-tender, and mild-distended  Labs:  CBC Latest Ref Rng & Units 01/27/2021 01/26/2021 01/23/2021  WBC 4.0 - 10.5 K/uL 12.4(H) 17.3(H) 7.3  Hemoglobin 13.0 - 17.0 g/dL 12.8(L) 15.0 14.6  Hematocrit 39.0 - 52.0 % 39.6 46.1 42.9  Platelets 150 - 400 K/uL 175 211 196   CMP Latest Ref Rng & Units 01/27/2021 01/26/2021 01/23/2021  Glucose 70 - 99 mg/dL 111(H) 148(H) 112(H)  BUN 8 - 23 mg/dL 18 14 12   Creatinine 0.61 - 1.24 mg/dL 1.30(H) 1.13 1.18  Sodium 135 - 145 mmol/L 138 138 140  Potassium 3.5 - 5.1 mmol/L 4.2 4.0 3.9  Chloride 98 - 111 mmol/L 104 103 106  CO2 22 - 32 mmol/L 27 26 26   Calcium 8.9 - 10.3 mg/dL 8.5(L) 9.0 9.1  Total Protein 6.5 - 8.1 g/dL 5.9(L) 7.2 -  Total Bilirubin 0.3 - 1.2 mg/dL 3.3(H) 3.3(H) -  Alkaline Phos 38 - 126 U/L 31(L) 33(L) -  AST 15 - 41 U/L 29 49(H) -  ALT 0 - 44 U/L 49(H) 75(H) -    Imaging studies: I personally evaluated the MRCP.  There is no filling defect in the common bile duct.  Do increase free fluid compared to CT scan..   Assessment/Plan:  Patient reevaluated.  There has been no  clinical deterioration.  Slowly improving abdominal pain.  I discussed with the patient the results of the MRCP.  Currently no sign of retained stone or bile leak.  No sign of complication from surgery.  We will continue with differential diagnosis of dehydration and ileus.  I advance his diet to full liquids.  I started on MiraLAX twice daily.  We will follow-up labs in the morning.  I encouraged the patient to ambulate.  This follow-up encounter was more than 30-minute most of the time counseling the patient and coordinating plan of care.  Arnold Long, MD
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
## 2021-01-27 NOTE — Progress Notes (Signed)
Pharmacy Antibiotic Note  Jack Shaw is a 60 y.o. male admitted on 01/26/2021 with intra-abdominal infection.  Pharmacy has been consulted for Zosyn dosing.  Plan: Zosyn 3.375 gm IV X 1 given over 6 hrs on 4/30 @ 2100. Zosyn 3.375 gm IV Q8H EI ordered to start on 5/1 @ 0300.   Height: 6' (182.9 cm) Weight: 108.9 kg (240 lb) IBW/kg (Calculated) : 77.6  Temp (24hrs), Avg:99.8 F (37.7 C), Min:99.1 F (37.3 C), Max:100.4 F (38 C)  Recent Labs  Lab 01/23/21 1027 01/26/21 1928 01/26/21 2028 01/26/21 2209  WBC 7.3 17.3*  --   --   CREATININE 1.18 1.13  --   --   LATICACIDVEN  --   --  2.1* 1.7    Estimated Creatinine Clearance: 87.5 mL/min (by C-G formula based on SCr of 1.13 mg/dL).    Allergies  Allergen Reactions  . Codeine Itching    Other reaction(s): psychological reaction, Unable to Obtain  . Hydrocodone Itching and Rash  . Oxycodone Itching and Rash  . Diphenoxylate-Atropine Rash    Diffuse with persistent itching    Antimicrobials this admission:   >>    >>   Dose adjustments this admission:  Microbiology results:  BCx:   UCx:    Sputum:    MRSA PCR:   Thank you for allowing pharmacy to be a part of this patient's care.  Jack Shaw D 01/27/2021 12:21 AM

## 2021-01-27 NOTE — Progress Notes (Signed)
PROGRESS NOTE    Jack Shaw  [id number] DOB: 1960-11-12 DOA: 01/26/2021 PCP: Stanford Breed, PA-C    Chief Complaint  Patient presents with  . Post-op Problem    Brief Narrative:  Jack Shaw is a 60 y.o. 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  He also reports worsening of his chronic left shoulder pain since his surgery  Subjective:  tmax 100.4 Reports ab pain, referred pain to left shoulder, feeling nauseous, no vomiting,  Last bm was on Wednesday, reports not passing gas He is returned from MRCP Family at bedside   Assessment & Plan:   Principal Problem:   Post-operative abdominal pain Active Problems:   Acute on Chronic left shoulder pain   CAD (coronary artery disease)   Diaphragm paralysis   Essential hypertension   S/P laparoscopic cholecystectomy   Sepsis (HCC)  Abdominal pain, leukocytosis, lactic acidosis, low-grade fever -status post elective laparoscopic cholecystectomy on 4/25 with robotic lysis of adhesions on 4/25 -He started on Zosyn on admission, blood culture in process -MRCP per general surgery, appreciate general surgery input, continue hydration, diet ordered per general surgery  Mild elevation of LFT, likely postop findings Appear trending down already,  Repeat in the morning, check CK Resume Lipitor  CKD 2 -Baseline creatinine 1.1, creatinine today is 1.3 -Repeat BMP in the morning -Currently lisinopril on hold  Acute on Chronic left shoulder pain - Pain control    CAD (coronary artery disease) - No  complaints of chest pain.  EKG nonacute -Resume aspirin and Lipitor on start on  diet  History of diaphragm paralysis - No acute concerns at this time    Essential hypertension - Hydralazine IV as needed Likely able to resumed lisinopril tomorrow   The patient's BMI is: Body mass index is 33.55 kg/m.Marland Kitchen      Unresulted Labs (From admission, onward)          Start     Ordered   02/02/21 0500  Creatinine, serum  (enoxaparin (LOVENOX)    CrCl >/= 30 ml/min)  Weekly,   STAT     Comments: while on enoxaparin therapy    01/26/21 2317   01/28/21 0500  CBC with Differential/Platelet  Tomorrow morning,   R        01/27/21 1525   01/28/21 0500  Comprehensive metabolic panel  Tomorrow morning,   R        01/27/21 1525   01/28/21 0500  Magnesium  Tomorrow morning,   R        01/27/21 1525   01/28/21 0500  Lactic acid, plasma  Tomorrow morning,   R        01/27/21 1525            DVT prophylaxis: Lovenox   Code Status: Full Family Communication: Family at bedside Disposition:   Status is: Inpatient  Dispo: The patient is from: Home              Anticipated d/c is to: Home              Anticipated d/c date is: To be determined, need general surgery clearance  Consultants:   General surgery  Procedures:   MRCP  Antimicrobials:   Anti-infectives (From admission, onward)   Start     Dose/Rate Route Frequency Ordered Stop   01/27/21 0300  piperacillin-tazobactam (ZOSYN) IVPB 3.375 g        3.375 g 12.5 mL/hr over 240 Minutes Intravenous Every 8 hours 01/27/21 0021     01/26/21 2030  piperacillin-tazobactam (ZOSYN) IVPB 3.375 g        3.375 g 100 mL/hr over 30 Minutes Intravenous  Once 01/26/21 2026 01/26/21 2124          Objective: Vitals:   01/26/21 2330 01/27/21 0021 01/27/21 0417 01/27/21 1503  BP: 118/72 140/90 113/65 (!) 134/93  Pulse: (!) 121 (!) 120 94 (!) 101  Resp: (!) 21 20 20 16   Temp:  99.1 F (37.3 C) 98.8 F (37.1 C) 98.5  F (36.9 C)  TempSrc:  Oral Oral   SpO2: 96% 96% 96% 97%  Weight:  112.2 kg    Height:  6' (1.829 m)      Intake/Output Summary (Last 24 hours) at 01/27/2021 1525 Last data filed at 01/27/2021 0612 Gross per 24 hour  Intake 1405.92 ml  Output --  Net 1405.92 ml   Filed Weights   01/26/21 1926 01/27/21 0021  Weight: 108.9 kg 112.2 kg    Examination:  General exam: calm, NAD Respiratory system: Clear to auscultation. Respiratory effort normal. Cardiovascular system: S1 & S2 heard, RRR.  No pedal edema. Gastrointestinal system: protuberant Abdomen soft , mild diffuse tenderness, hypoactive  bowel sounds  Central nervous system: Alert and oriented. No focal neurological deficits. Extremities: Symmetric 5 x 5 power. Skin: No rashes, lesions or ulcers Psychiatry: Judgement and insight appear normal. Mood & affect appropriate.     Data Reviewed: I have personally reviewed following labs and imaging studies  CBC: Recent Labs  Lab 01/23/21 1027 01/26/21 1928 01/27/21 0530  WBC 7.3 17.3* 12.4*  HGB 14.6 15.0 12.8*  HCT 42.9 46.1 39.6  MCV 92.5 95.8 96.6  PLT 196 211 0000000    Basic Metabolic Panel: Recent Labs  Lab 01/23/21 1027 01/26/21 1928 01/27/21 0530  NA 140 138 138  K 3.9 4.0 4.2  CL 106 103 104  CO2 26 26 27   GLUCOSE 112* 148* 111*  BUN 12 14 18   CREATININE 1.18 1.13 1.30*  CALCIUM 9.1 9.0 8.5*    GFR: Estimated Creatinine Clearance: 77.1 mL/min (A) (by C-G formula based on SCr of 1.3 mg/dL (H)).  Liver Function Tests: Recent Labs  Lab 01/26/21 1928 01/27/21 0530  AST 49* 29  ALT 75* 49*  ALKPHOS 33* 31*  BILITOT 3.3* 3.3*  PROT 7.2 5.9*  ALBUMIN 3.7 2.9*    CBG: No results for input(s): GLUCAP in the last 168 hours.   Recent Results (from the past 240 hour(s))  SARS CORONAVIRUS 2 (TAT 6-24 HRS) Nasopharyngeal Nasopharyngeal Swab     Status: None   Collection Time: 01/23/21 10:46 AM   Specimen: Nasopharyngeal Swab  Result Value Ref Range  Status   SARS Coronavirus 2 NEGATIVE NEGATIVE Final    Comment: (NOTE) SARS-CoV-2 target nucleic acids are NOT DETECTED.  The SARS-CoV-2 RNA is generally detectable in upper and lower respiratory specimens during the acute phase of infection. Negative results do not preclude SARS-CoV-2 infection, do not rule out co-infections with other pathogens, and should not be used as the sole basis for treatment or other patient management decisions. Negative results must be combined with  clinical observations, patient history, and epidemiological information. The expected result is Negative.  Fact Sheet for Patients: SugarRoll.be  Fact Sheet for Healthcare Providers: https://www.woods-mathews.com/  This test is not yet approved or cleared by the [place] FDA and  has been authorized for detection and/or diagnosis of SARS-CoV-2 by FDA under an Emergency Use Authorization (EUA). This EUA will remain  in effect (meaning this test can be used) for the duration of the COVID-19 declaration under Se ction 564(b)(1) of the Act, 21 U.S.C. section 360bbb-3(b)(1), unless the authorization is terminated or revoked sooner.  Performed at [facility], [place] [address], [place], [place] [address]   Culture, blood (routine x 2)     Status: None (Preliminary result)   Collection Time: 01/26/21  8:28 PM   Specimen: BLOOD  Result Value Ref Range Status   Specimen Description BLOOD RIGHT ANTECUBITAL  Final   Special Requests   Final    BOTTLES DRAWN AEROBIC AND ANAEROBIC Blood Culture results may not be optimal due to an inadequate volume of blood received in culture bottles   Culture   Final    NO GROWTH < 12 HOURS Performed at [facility], [address], [place], [place] [address]    Report Status PENDING  Incomplete  Culture, blood (routine x 2)     Status: None (Preliminary result)   Collection Time: 01/26/21  8:28 PM   Specimen: BLOOD   Result Value Ref Range Status   Specimen Description BLOOD BLOOD RIGHT FOREARM  Final   Special Requests   Final    BOTTLES DRAWN AEROBIC AND ANAEROBIC Blood Culture adequate volume   Culture   Final    NO GROWTH < 12 HOURS Performed at [facility], [address], [place], [place] [address]    Report Status PENDING  Incomplete  Resp Panel by RT-PCR (Flu A&B, Covid) Nasopharyngeal Swab     Status: None   Collection Time: 01/26/21 10:09 PM   Specimen: Nasopharyngeal Swab; Nasopharyngeal(NP) swabs in vial transport medium  Result Value Ref Range Status   SARS Coronavirus 2 by RT PCR NEGATIVE NEGATIVE Final    Comment: (NOTE) SARS-CoV-2 target nucleic acids are NOT DETECTED.  The SARS-CoV-2 RNA is generally detectable in upper respiratory specimens during the acute phase of infection. The lowest concentration of SARS-CoV-2 viral copies this assay can detect is 138 copies/mL. A negative result does not preclude SARS-Cov-2 infection and should not be used as the sole basis for treatment or other patient management decisions. A negative result may occur with  improper specimen collection/handling, submission of specimen other than nasopharyngeal swab, presence of viral mutation(s) within the areas targeted by this assay, and inadequate number of viral copies(<138 copies/mL). A negative result must be combined with clinical observations, patient history, and epidemiological information. The expected result is Negative.  Fact Sheet for Patients:  EntrepreneurPulse.com.au  Fact Sheet for Healthcare Providers:  IncredibleEmployment.be  This test is no t yet approved or cleared by the [place] FDA and  has been authorized for detection and/or diagnosis of SARS-CoV-2 by FDA under an Emergency Use Authorization (EUA). This EUA will remain  in effect (meaning this test can be used) for the duration of the COVID-19 declaration under  Section 564(b)(1) of the Act, 21 U.S.C.section 360bbb-3(b)(1), unless the authorization is terminated  or revoked sooner.       Influenza A by PCR NEGATIVE NEGATIVE Final   Influenza B by PCR NEGATIVE NEGATIVE Final  Comment: (NOTE) The Xpert Xpress SARS-CoV-2/FLU/RSV plus assay is intended as an aid in the diagnosis of influenza from Nasopharyngeal swab specimens and should not be used as a sole basis for treatment. Nasal washings and aspirates are unacceptable for Xpert Xpress SARS-CoV-2/FLU/RSV testing.  Fact Sheet for Patients: EntrepreneurPulse.com.au  Fact Sheet for Healthcare Providers: IncredibleEmployment.be  This test is not yet approved or cleared by the [place] FDA and has been authorized for detection and/or diagnosis of SARS-CoV-2 by FDA under an Emergency Use Authorization (EUA). This EUA will remain in effect (meaning this test can be used) for the duration of the COVID-19 declaration under Section 564(b)(1) of the Act, 21 U.S.C. section 360bbb-3(b)(1), unless the authorization is terminated or revoked.  Performed at [facility], [address] [place], [place] [address]          Radiology Studies: CT Abdomen Pelvis W Contrast  Result Date: 01/26/2021 CLINICAL DATA:  Cholecystectomy 2 days ago, upper abdominal pain, nausea and vomiting, constipation EXAM: CT ABDOMEN AND PELVIS WITH CONTRAST TECHNIQUE: Multidetector CT imaging of the abdomen and pelvis was performed using the standard protocol following bolus administration of intravenous contrast. CONTRAST:  117mL OMNIPAQUE IOHEXOL 300 MG/ML  SOLN COMPARISON:  01/15/2021 FINDINGS: Lower chest: Hypoventilatory changes at the lung bases greatest in the left lower lobe. No acute pleural or parenchymal lung disease. Hepatobiliary: Stable hepatic cysts. No focal liver abnormality. No biliary duct dilation. Gallbladder is surgically absent, with fluid in the  gallbladder fossa most consistent with recent surgery. No fluid collection or abscess. Pancreas: Unremarkable. No pancreatic ductal dilatation or surrounding inflammatory changes. Spleen: Normal in size without focal abnormality. Adrenals/Urinary Tract: There are bilateral nonobstructing renal calculi, measuring 5 mm on the right and 3 mm on the left. No obstructive uropathy within either kidney. Normal renal parenchymal enhancement. The adrenals and bladder are unremarkable. Stomach/Bowel: No bowel obstruction or ileus. Postsurgical changes from prior small bowel resection and reanastomosis within the right lower quadrant. Scattered diverticulosis of the distal colon without diverticulitis. No bowel wall thickening or inflammatory change. Vascular/Lymphatic: No significant vascular findings are present. No enlarged abdominal or pelvic lymph nodes. Reproductive: Prostate is unremarkable. Other: Trace upper abdominal ascites consistent with recent cholecystectomy. Punctate foci of pneumoperitoneum within the upper abdomen are also consistent with recent surgical intervention. There is no fluid collection or abscess.  No abdominal wall hernia. Chronic postsurgical changes are seen along the right hemidiaphragm. Musculoskeletal: No acute or destructive bony lesions. Reconstructed images demonstrate no additional findings. IMPRESSION: 1. Postsurgical changes from recent cholecystectomy, with free fluid and punctate foci of free gas in the upper abdomen as above. No fluid collection or abscess. 2. Sigmoid diverticulosis without diverticulitis. 3. Bilateral nonobstructing renal calculi. Electronically Signed   By: Randa Ngo M.D.   On: 01/26/2021 21:26   MR ABDOMEN MRCP WO CONTRAST  Result Date: 01/27/2021 CLINICAL DATA:  Abdominal pain and bloating. Patient is status post cholecystectomy on 01/24/2021. EXAM: MRI ABDOMEN WITHOUT CONTRAST  (INCLUDING MRCP) TECHNIQUE: Multiplanar multisequence MR imaging of the  abdomen was performed. Heavily T2-weighted images of the biliary and pancreatic ducts were obtained, and three-dimensional MRCP images were rendered by post processing. COMPARISON:  CT scan 01/26/2021.  CT scan 11/23/2014. FINDINGS: Lower chest: Atelectasis noted medial left base. Hepatobiliary: Diffuse fatty deposition noted in the liver parenchyma. Small cystic lesions in the central liver have been present since the CT of 05/24/2015 compatible with cysts. Gallbladder surgically absent. Small simple fluid collection identified in the  gallbladder fossa with small 6.6 x 2.2 cm collection noted anterior to the liver, similar to yesterday's CT scan. Small fluid collection identified in the gallbladder fossa and adjacent to the proximal stomach, similar to yesterday's CT scan. No biliary dilatation. No evidence for choledocholithiasis. Pancreas: No focal mass lesion. No dilatation of the main duct. No intraparenchymal cyst. No peripancreatic edema. Spleen:  No splenomegaly. No focal mass lesion. Adrenals/Urinary Tract: No adrenal nodule or mass. Tiny T2 hyperintensities in both kidneys are likely cysts. Stomach/Bowel: Stomach is unremarkable. No gastric wall thickening. No evidence of outlet obstruction. Duodenum is normally positioned as is the ligament of Treitz. No bowel dilatation within the visualized abdomen. Vascular/Lymphatic: No abdominal aortic aneurysm. No abdominal lymphadenopathy. Other:  Trace mesenteric free fluid evident. Musculoskeletal: No suspicious marrow signal abnormality. IMPRESSION: 1. Status post cholecystectomy. Common bile duct has normal MRCP imaging appearance in there is no intra or extrahepatic biliary duct dilatation. No choledocholithiasis. Small simple fluid collection in the gallbladder fossa and anterior to the liver, similar to yesterday's CT scan. 2. Similar appearance of fluid collections in the gallbladder fossa, anterior to the liver, and along the proximal stomach. No  substantial increase in any of these collections in the interval. If there is clinical concern for bile leak, nuclear scintigraphy may prove helpful to further evaluate 3. Hepatic steatosis. Electronically Signed   By: Misty Stanley M.D.   On: 01/27/2021 14:39   MR 3D Recon At Scanner  Result Date: 01/27/2021 CLINICAL DATA:  Abdominal pain and bloating. Patient is status post cholecystectomy on 01/24/2021. EXAM: MRI ABDOMEN WITHOUT CONTRAST  (INCLUDING MRCP) TECHNIQUE: Multiplanar multisequence MR imaging of the abdomen was performed. Heavily T2-weighted images of the biliary and pancreatic ducts were obtained, and three-dimensional MRCP images were rendered by post processing. COMPARISON:  CT scan 01/26/2021.  CT scan 11/23/2014. FINDINGS: Lower chest: Atelectasis noted medial left base. Hepatobiliary: Diffuse fatty deposition noted in the liver parenchyma. Small cystic lesions in the central liver have been present since the CT of 05/24/2015 compatible with cysts. Gallbladder surgically absent. Small simple fluid collection identified in the gallbladder fossa with small 6.6 x 2.2 cm collection noted anterior to the liver, similar to yesterday's CT scan. Small fluid collection identified in the gallbladder fossa and adjacent to the proximal stomach, similar to yesterday's CT scan. No biliary dilatation. No evidence for choledocholithiasis. Pancreas: No focal mass lesion. No dilatation of the main duct. No intraparenchymal cyst. No peripancreatic edema. Spleen:  No splenomegaly. No focal mass lesion. Adrenals/Urinary Tract: No adrenal nodule or mass. Tiny T2 hyperintensities in both kidneys are likely cysts. Stomach/Bowel: Stomach is unremarkable. No gastric wall thickening. No evidence of outlet obstruction. Duodenum is normally positioned as is the ligament of Treitz. No bowel dilatation within the visualized abdomen. Vascular/Lymphatic: No abdominal aortic aneurysm. No abdominal lymphadenopathy. Other:  Trace  mesenteric free fluid evident. Musculoskeletal: No suspicious marrow signal abnormality. IMPRESSION: 1. Status post cholecystectomy. Common bile duct has normal MRCP imaging appearance in there is no intra or extrahepatic biliary duct dilatation. No choledocholithiasis. Small simple fluid collection in the gallbladder fossa and anterior to the liver, similar to yesterday's CT scan. 2. Similar appearance of fluid collections in the gallbladder fossa, anterior to the liver, and along the proximal stomach. No substantial increase in any of these collections in the interval. If there is clinical concern for bile leak, nuclear scintigraphy may prove helpful to further evaluate 3. Hepatic steatosis. Electronically Signed   By: Verda Cumins.D.  On: 01/27/2021 14:39        Scheduled Meds: . enoxaparin (LOVENOX) injection  0.5 mg/kg Subcutaneous Q24H  . polyethylene glycol  17 g Oral BID   Continuous Infusions: . lactated ringers Stopped (01/27/21 0341)  . piperacillin-tazobactam (ZOSYN)  IV 3.375 g (01/27/21 1013)     LOS: 1 day   Time spent: 69mins Greater than 50% of this time was spent in counseling, explanation of diagnosis, planning of further management, and coordination of care.   Voice Recognition Viviann Spare dictation system was used to create this note, attempts have been made to correct errors. Please contact the author with questions and/or clarifications.   Florencia Reasons, MD PhD FACP [facility]  Available via Epic secure chat 7am-7pm for nonurgent issues Please page for urgent issues To page the attending provider between 7A-7P or the covering provider during after hours 7P-7A, please log into the web site [email or web address] and access using universal [place] password for that web site. If you do not have the password, please call the hospital operator.    01/27/2021, 3:25 PM

## 2021-01-27 NOTE — Progress Notes (Signed)
Informed Dr. Damita Dunnings lactic acid is 2.1

## 2021-01-28 ENCOUNTER — Inpatient Hospital Stay: Payer: BLUE CROSS/BLUE SHIELD

## 2021-01-28 DIAGNOSIS — K839 Disease of biliary tract, unspecified: Secondary | ICD-10-CM | POA: Diagnosis not present

## 2021-01-28 DIAGNOSIS — K9189 Other postprocedural complications and disorders of digestive system: Secondary | ICD-10-CM | POA: Diagnosis present

## 2021-01-28 DIAGNOSIS — J986 Disorders of diaphragm: Secondary | ICD-10-CM | POA: Diagnosis present

## 2021-01-28 DIAGNOSIS — E785 Hyperlipidemia, unspecified: Secondary | ICD-10-CM | POA: Diagnosis present

## 2021-01-28 DIAGNOSIS — Q445 Other congenital malformations of bile ducts: Secondary | ICD-10-CM | POA: Diagnosis not present

## 2021-01-28 DIAGNOSIS — E860 Dehydration: Secondary | ICD-10-CM | POA: Diagnosis present

## 2021-01-28 DIAGNOSIS — J45909 Unspecified asthma, uncomplicated: Secondary | ICD-10-CM | POA: Diagnosis present

## 2021-01-28 DIAGNOSIS — G8918 Other acute postprocedural pain: Secondary | ICD-10-CM | POA: Diagnosis not present

## 2021-01-28 DIAGNOSIS — Z7982 Long term (current) use of aspirin: Secondary | ICD-10-CM | POA: Diagnosis not present

## 2021-01-28 DIAGNOSIS — I10 Essential (primary) hypertension: Secondary | ICD-10-CM | POA: Diagnosis not present

## 2021-01-28 DIAGNOSIS — Z888 Allergy status to other drugs, medicaments and biological substances status: Secondary | ICD-10-CM | POA: Diagnosis not present

## 2021-01-28 DIAGNOSIS — M25512 Pain in left shoulder: Secondary | ICD-10-CM | POA: Diagnosis present

## 2021-01-28 DIAGNOSIS — I129 Hypertensive chronic kidney disease with stage 1 through stage 4 chronic kidney disease, or unspecified chronic kidney disease: Secondary | ICD-10-CM | POA: Diagnosis present

## 2021-01-28 DIAGNOSIS — R6520 Severe sepsis without septic shock: Secondary | ICD-10-CM | POA: Diagnosis present

## 2021-01-28 DIAGNOSIS — T8144XA Sepsis following a procedure, initial encounter: Secondary | ICD-10-CM | POA: Diagnosis present

## 2021-01-28 DIAGNOSIS — E872 Acidosis: Secondary | ICD-10-CM | POA: Diagnosis present

## 2021-01-28 DIAGNOSIS — G8929 Other chronic pain: Secondary | ICD-10-CM | POA: Diagnosis present

## 2021-01-28 DIAGNOSIS — N179 Acute kidney failure, unspecified: Secondary | ICD-10-CM | POA: Diagnosis present

## 2021-01-28 DIAGNOSIS — Z79899 Other long term (current) drug therapy: Secondary | ICD-10-CM | POA: Diagnosis not present

## 2021-01-28 DIAGNOSIS — Y838 Other surgical procedures as the cause of abnormal reaction of the patient, or of later complication, without mention of misadventure at the time of the procedure: Secondary | ICD-10-CM | POA: Diagnosis present

## 2021-01-28 DIAGNOSIS — I2510 Atherosclerotic heart disease of native coronary artery without angina pectoris: Secondary | ICD-10-CM | POA: Diagnosis present

## 2021-01-28 DIAGNOSIS — K811 Chronic cholecystitis: Secondary | ICD-10-CM | POA: Diagnosis present

## 2021-01-28 DIAGNOSIS — N182 Chronic kidney disease, stage 2 (mild): Secondary | ICD-10-CM | POA: Diagnosis present

## 2021-01-28 DIAGNOSIS — G4730 Sleep apnea, unspecified: Secondary | ICD-10-CM | POA: Diagnosis present

## 2021-01-28 DIAGNOSIS — G4733 Obstructive sleep apnea (adult) (pediatric): Secondary | ICD-10-CM | POA: Diagnosis present

## 2021-01-28 DIAGNOSIS — Z20822 Contact with and (suspected) exposure to covid-19: Secondary | ICD-10-CM | POA: Diagnosis present

## 2021-01-28 DIAGNOSIS — K838 Other specified diseases of biliary tract: Secondary | ICD-10-CM | POA: Diagnosis not present

## 2021-01-28 DIAGNOSIS — N400 Enlarged prostate without lower urinary tract symptoms: Secondary | ICD-10-CM | POA: Diagnosis present

## 2021-01-28 DIAGNOSIS — H47011 Ischemic optic neuropathy, right eye: Secondary | ICD-10-CM | POA: Diagnosis present

## 2021-01-28 DIAGNOSIS — Z885 Allergy status to narcotic agent status: Secondary | ICD-10-CM | POA: Diagnosis not present

## 2021-01-28 LAB — BILIRUBIN, DIRECT: Bilirubin, Direct: 2.3 mg/dL — ABNORMAL HIGH (ref 0.0–0.2)

## 2021-01-28 LAB — CBC WITH DIFFERENTIAL/PLATELET
Abs Immature Granulocytes: 0.04 10*3/uL (ref 0.00–0.07)
Basophils Absolute: 0 10*3/uL (ref 0.0–0.1)
Basophils Relative: 0 %
Eosinophils Absolute: 0.3 10*3/uL (ref 0.0–0.5)
Eosinophils Relative: 3 %
HCT: 40.5 % (ref 39.0–52.0)
Hemoglobin: 13.6 g/dL (ref 13.0–17.0)
Immature Granulocytes: 1 %
Lymphocytes Relative: 11 %
Lymphs Abs: 1 10*3/uL (ref 0.7–4.0)
MCH: 32.1 pg (ref 26.0–34.0)
MCHC: 33.6 g/dL (ref 30.0–36.0)
MCV: 95.5 fL (ref 80.0–100.0)
Monocytes Absolute: 0.6 10*3/uL (ref 0.1–1.0)
Monocytes Relative: 7 %
Neutro Abs: 6.9 10*3/uL (ref 1.7–7.7)
Neutrophils Relative %: 78 %
Platelets: 193 10*3/uL (ref 150–400)
RBC: 4.24 MIL/uL (ref 4.22–5.81)
RDW: 13.1 % (ref 11.5–15.5)
WBC: 8.8 10*3/uL (ref 4.0–10.5)
nRBC: 0 % (ref 0.0–0.2)

## 2021-01-28 LAB — COMPREHENSIVE METABOLIC PANEL
ALT: 38 U/L (ref 0–44)
AST: 23 U/L (ref 15–41)
Albumin: 2.9 g/dL — ABNORMAL LOW (ref 3.5–5.0)
Alkaline Phosphatase: 34 U/L — ABNORMAL LOW (ref 38–126)
Anion gap: 11 (ref 5–15)
BUN: 26 mg/dL — ABNORMAL HIGH (ref 8–23)
CO2: 26 mmol/L (ref 22–32)
Calcium: 8.7 mg/dL — ABNORMAL LOW (ref 8.9–10.3)
Chloride: 103 mmol/L (ref 98–111)
Creatinine, Ser: 1.35 mg/dL — ABNORMAL HIGH (ref 0.61–1.24)
GFR, Estimated: 60 mL/min — ABNORMAL LOW (ref >60)
Glucose, Bld: 133 mg/dL — ABNORMAL HIGH (ref 70–99)
Potassium: 3.8 mmol/L (ref 3.5–5.1)
Sodium: 140 mmol/L (ref 135–145)
Total Bilirubin: 4.1 mg/dL — ABNORMAL HIGH (ref 0.3–1.2)
Total Protein: 6.2 g/dL — ABNORMAL LOW (ref 6.5–8.1)

## 2021-01-28 LAB — SURGICAL PATHOLOGY

## 2021-01-28 LAB — LACTIC ACID, PLASMA: Lactic Acid, Venous: 2 mmol/L (ref 0.5–1.9)

## 2021-01-28 LAB — MAGNESIUM: Magnesium: 2 mg/dL (ref 1.7–2.4)

## 2021-01-28 LAB — CK: Total CK: 95 U/L (ref 49–397)

## 2021-01-28 MED ORDER — SODIUM CHLORIDE 0.9 % IV BOLUS
1000.0000 mL | Freq: Once | INTRAVENOUS | Status: AC
  Administered 2021-01-28: 1000 mL via INTRAVENOUS

## 2021-01-28 MED ORDER — TECHNETIUM TC 99M MEBROFENIN IV KIT
7.5000 | PACK | Freq: Once | INTRAVENOUS | Status: AC | PRN
  Administered 2021-01-28: 7.4 via INTRAVENOUS

## 2021-01-28 MED ORDER — ALBUMIN HUMAN 25 % IV SOLN
25.0000 g | Freq: Once | INTRAVENOUS | Status: AC
  Administered 2021-01-28: 25 g via INTRAVENOUS
  Filled 2021-01-28: qty 100

## 2021-01-28 MED ORDER — METOPROLOL TARTRATE 25 MG PO TABS
12.5000 mg | ORAL_TABLET | Freq: Two times a day (BID) | ORAL | Status: DC
  Administered 2021-01-28 – 2021-01-31 (×6): 12.5 mg via ORAL
  Filled 2021-01-28 (×7): qty 1

## 2021-01-28 MED ORDER — ALUM & MAG HYDROXIDE-SIMETH 200-200-20 MG/5ML PO SUSP
30.0000 mL | ORAL | Status: DC | PRN
  Administered 2021-01-28 – 2021-01-30 (×2): 30 mL via ORAL
  Filled 2021-01-28 (×2): qty 30

## 2021-01-28 MED ORDER — KETOROLAC TROMETHAMINE 30 MG/ML IJ SOLN
15.0000 mg | Freq: Four times a day (QID) | INTRAMUSCULAR | Status: DC
  Administered 2021-01-28 – 2021-01-30 (×7): 15 mg via INTRAVENOUS
  Filled 2021-01-28 (×9): qty 1

## 2021-01-28 MED ORDER — ACETAMINOPHEN 10 MG/ML IV SOLN
1000.0000 mg | Freq: Four times a day (QID) | INTRAVENOUS | Status: AC
  Administered 2021-01-28 – 2021-01-29 (×3): 1000 mg via INTRAVENOUS
  Filled 2021-01-28 (×4): qty 100

## 2021-01-28 MED ORDER — SODIUM CHLORIDE 0.9 % IV SOLN: INTRAVENOUS | Status: DC | PRN

## 2021-01-28 NOTE — Progress Notes (Signed)
   01/28/21 1200  Assess: MEWS Score  Temp 99.4 F (37.4 C)  BP (!) 147/88  Pulse Rate (!) 112  Resp 20  Level of Consciousness Alert  SpO2 93 %  O2 Device Room Air  Assess: MEWS Score  MEWS Temp 0  MEWS Systolic 0  MEWS Pulse 2  MEWS RR 0  MEWS LOC 0  MEWS Score 2  MEWS Score Color Yellow  Assess: if the MEWS score is Yellow or Red  Were vital signs taken at a resting state? Yes  Focused Assessment Change from prior assessment (see assessment flowsheet)  Early Detection of Sepsis Score *See Row Information* Low  MEWS guidelines implemented *See Row Information* Yes  Treat  MEWS Interventions Administered scheduled meds/treatments  Pain Scale 0-10  Pain Score 10  Pain Type Acute pain  Pain Location Shoulder  Pain Orientation Left  Pain Descriptors / Indicators Aching  Pain Frequency Constant  Pain Onset With Activity  Patients Stated Pain Goal 2  Pain Intervention(s) Medication (See eMAR)  Multiple Pain Sites No  Complains of Other (Comment) (pain)  Interventions Medication (see MAR)  Patients response to intervention Effective  Take Vital Signs  Increase Vital Sign Frequency  Yellow: Q 2hr X 2 then Q 4hr X 2, if remains yellow, continue Q 4hrs  Escalate  MEWS: Escalate Yellow: discuss with charge nurse/RN and consider discussing with provider and RRT  Notify: Charge Nurse/RN  Name of Charge Nurse/RN Notified Rochel Brome RN  Date Charge Nurse/RN Notified 01/28/21  Time Charge Nurse/RN Notified 1205  Notify: Provider  Provider Name/Title Dr. Fritzi Mandes  Date Provider Notified 01/28/21  Time Provider Notified 1206  Notification Type Face-to-face  Notification Reason Change in status  Provider response See new orders  Date of Provider Response 01/28/21  Time of Provider Response 1207  Document  Patient Outcome Stabilized after interventions  Progress note created (see row info) Yes
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
## 2021-01-28 NOTE — Progress Notes (Signed)
[facility] NAME: Jack Shaw    MR#:  [id number]  DATE OF BIRTH:  05-15-1961  SUBJECTIVE:  complains of intermittent left shoulder pain. Able to rotate all the way around. Denies any weakness in left arm. No abdominal pain. Wondering when he can go home.  REVIEW OF SYSTEMS:   Review of Systems  Constitutional: Negative for chills, fever and weight loss.  HENT: Negative for ear discharge, ear pain and nosebleeds.   Eyes: Negative for blurred vision, pain and discharge.  Respiratory: Negative for sputum production, shortness of breath, wheezing and stridor.   Cardiovascular: Negative for chest pain, palpitations, orthopnea and PND.  Gastrointestinal: Negative for abdominal pain, diarrhea, nausea and vomiting.  Genitourinary: Negative for frequency and urgency.  Musculoskeletal: Positive for joint pain. Negative for back pain.  Neurological: Negative for sensory change, speech change, focal weakness and weakness.  Psychiatric/Behavioral: Negative for depression and hallucinations. The patient is not nervous/anxious.    Tolerating Diet:npo Tolerating PT: not needed  DRUG ALLERGIES:   Allergies  Allergen Reactions  . Codeine Itching    Other reaction(s): psychological reaction, Unable to Obtain  . Hydrocodone Itching and Rash  . Oxycodone Itching and Rash  . Diphenoxylate-Atropine Rash    Diffuse with persistent itching    VITALS:  Blood pressure (!) 158/84, pulse (!) 109, temperature 98.9 F (37.2 C), temperature source Oral, resp. rate 18, height 6' (1.829 m), weight 112.2 kg, SpO2 96 %.  PHYSICAL EXAMINATION:   Physical Exam  GENERAL:  60 y.o.-year-old patient lying in the bed with no acute distress.  HEENT: Head atraumatic, normocephalic. Oropharynx and nasopharynx clear.  NECK:  Supple, no jugular venous distention. No thyroid enlargement, no tenderness.  LUNGS: Normal breath sounds bilaterally, no wheezing,  rales, rhonchi. No use of accessory muscles of respiration.  CARDIOVASCULAR: S1, S2 normal. No murmurs, rubs, or gallops.  ABDOMEN: Soft, nontender, nondistended. Bowel sounds present. No organomegaly or mass.  EXTREMITIES: No cyanosis, clubbing or edema b/l.    NEUROLOGIC: Cranial nerves II through XII are intact. No focal Motor or sensory deficits b/l.   PSYCHIATRIC:  patient is alert and oriented x 3.  SKIN: No obvious rash, lesion, or ulcer.   LABORATORY PANEL:  CBC Recent Labs  Lab 01/28/21 0444  WBC 8.8  HGB 13.6  HCT 40.5  PLT 193    Chemistries  Recent Labs  Lab 01/28/21 0444  NA 140  K 3.8  CL 103  CO2 26  GLUCOSE 133*  BUN 26*  CREATININE 1.35*  CALCIUM 8.7*  MG 2.0  AST 23  ALT 38  ALKPHOS 34*  BILITOT 4.1*   Cardiac Enzymes No results for input(s): TROPONINI in the last 168 hours. RADIOLOGY:  CT Abdomen Pelvis W Contrast  Result Date: 01/26/2021 CLINICAL DATA:  Cholecystectomy 2 days ago, upper abdominal pain, nausea and vomiting, constipation EXAM: CT ABDOMEN AND PELVIS WITH CONTRAST TECHNIQUE: Multidetector CT imaging of the abdomen and pelvis was performed using the standard protocol following bolus administration of intravenous contrast. CONTRAST:  172mL OMNIPAQUE IOHEXOL 300 MG/ML  SOLN COMPARISON:  01/15/2021 FINDINGS: Lower chest: Hypoventilatory changes at the lung bases greatest in the left lower lobe. No acute pleural or parenchymal lung disease. Hepatobiliary: Stable hepatic cysts. No focal liver abnormality. No biliary duct dilation. Gallbladder is surgically absent, with fluid in the gallbladder fossa most consistent with recent surgery. No fluid collection or abscess. Pancreas: Unremarkable. No pancreatic ductal dilatation or  surrounding inflammatory changes. Spleen: Normal in size without focal abnormality. Adrenals/Urinary Tract: There are bilateral nonobstructing renal calculi, measuring 5 mm on the right and 3 mm on the left. No obstructive  uropathy within either kidney. Normal renal parenchymal enhancement. The adrenals and bladder are unremarkable. Stomach/Bowel: No bowel obstruction or ileus. Postsurgical changes from prior small bowel resection and reanastomosis within the right lower quadrant. Scattered diverticulosis of the distal colon without diverticulitis. No bowel wall thickening or inflammatory change. Vascular/Lymphatic: No significant vascular findings are present. No enlarged abdominal or pelvic lymph nodes. Reproductive: Prostate is unremarkable. Other: Trace upper abdominal ascites consistent with recent cholecystectomy. Punctate foci of pneumoperitoneum within the upper abdomen are also consistent with recent surgical intervention. There is no fluid collection or abscess.  No abdominal wall hernia. Chronic postsurgical changes are seen along the right hemidiaphragm. Musculoskeletal: No acute or destructive bony lesions. Reconstructed images demonstrate no additional findings. IMPRESSION: 1. Postsurgical changes from recent cholecystectomy, with free fluid and punctate foci of free gas in the upper abdomen as above. No fluid collection or abscess. 2. Sigmoid diverticulosis without diverticulitis. 3. Bilateral nonobstructing renal calculi. Electronically Signed   By: Randa Ngo M.D.   On: 01/26/2021 21:26   MR ABDOMEN MRCP WO CONTRAST  Result Date: 01/27/2021 CLINICAL DATA:  Abdominal pain and bloating. Patient is status post cholecystectomy on 01/24/2021. EXAM: MRI ABDOMEN WITHOUT CONTRAST  (INCLUDING MRCP) TECHNIQUE: Multiplanar multisequence MR imaging of the abdomen was performed. Heavily T2-weighted images of the biliary and pancreatic ducts were obtained, and three-dimensional MRCP images were rendered by post processing. COMPARISON:  CT scan 01/26/2021.  CT scan 11/23/2014. FINDINGS: Lower chest: Atelectasis noted medial left base. Hepatobiliary: Diffuse fatty deposition noted in the liver parenchyma. Small cystic lesions  in the central liver have been present since the CT of 05/24/2015 compatible with cysts. Gallbladder surgically absent. Small simple fluid collection identified in the gallbladder fossa with small 6.6 x 2.2 cm collection noted anterior to the liver, similar to yesterday's CT scan. Small fluid collection identified in the gallbladder fossa and adjacent to the proximal stomach, similar to yesterday's CT scan. No biliary dilatation. No evidence for choledocholithiasis. Pancreas: No focal mass lesion. No dilatation of the main duct. No intraparenchymal cyst. No peripancreatic edema. Spleen:  No splenomegaly. No focal mass lesion. Adrenals/Urinary Tract: No adrenal nodule or mass. Tiny T2 hyperintensities in both kidneys are likely cysts. Stomach/Bowel: Stomach is unremarkable. No gastric wall thickening. No evidence of outlet obstruction. Duodenum is normally positioned as is the ligament of Treitz. No bowel dilatation within the visualized abdomen. Vascular/Lymphatic: No abdominal aortic aneurysm. No abdominal lymphadenopathy. Other:  Trace mesenteric free fluid evident. Musculoskeletal: No suspicious marrow signal abnormality. IMPRESSION: 1. Status post cholecystectomy. Common bile duct has normal MRCP imaging appearance in there is no intra or extrahepatic biliary duct dilatation. No choledocholithiasis. Small simple fluid collection in the gallbladder fossa and anterior to the liver, similar to yesterday's CT scan. 2. Similar appearance of fluid collections in the gallbladder fossa, anterior to the liver, and along the proximal stomach. No substantial increase in any of these collections in the interval. If there is clinical concern for bile leak, nuclear scintigraphy may prove helpful to further evaluate 3. Hepatic steatosis. Electronically Signed   By: Misty Stanley M.D.   On: 01/27/2021 14:39   MR 3D Recon At Scanner  Result Date: 01/27/2021 CLINICAL DATA:  Abdominal pain and bloating. Patient is status post  cholecystectomy on 01/24/2021. EXAM: MRI ABDOMEN WITHOUT  CONTRAST  (INCLUDING MRCP) TECHNIQUE: Multiplanar multisequence MR imaging of the abdomen was performed. Heavily T2-weighted images of the biliary and pancreatic ducts were obtained, and three-dimensional MRCP images were rendered by post processing. COMPARISON:  CT scan 01/26/2021.  CT scan 11/23/2014. FINDINGS: Lower chest: Atelectasis noted medial left base. Hepatobiliary: Diffuse fatty deposition noted in the liver parenchyma. Small cystic lesions in the central liver have been present since the CT of 05/24/2015 compatible with cysts. Gallbladder surgically absent. Small simple fluid collection identified in the gallbladder fossa with small 6.6 x 2.2 cm collection noted anterior to the liver, similar to yesterday's CT scan. Small fluid collection identified in the gallbladder fossa and adjacent to the proximal stomach, similar to yesterday's CT scan. No biliary dilatation. No evidence for choledocholithiasis. Pancreas: No focal mass lesion. No dilatation of the main duct. No intraparenchymal cyst. No peripancreatic edema. Spleen:  No splenomegaly. No focal mass lesion. Adrenals/Urinary Tract: No adrenal nodule or mass. Tiny T2 hyperintensities in both kidneys are likely cysts. Stomach/Bowel: Stomach is unremarkable. No gastric wall thickening. No evidence of outlet obstruction. Duodenum is normally positioned as is the ligament of Treitz. No bowel dilatation within the visualized abdomen. Vascular/Lymphatic: No abdominal aortic aneurysm. No abdominal lymphadenopathy. Other:  Trace mesenteric free fluid evident. Musculoskeletal: No suspicious marrow signal abnormality. IMPRESSION: 1. Status post cholecystectomy. Common bile duct has normal MRCP imaging appearance in there is no intra or extrahepatic biliary duct dilatation. No choledocholithiasis. Small simple fluid collection in the gallbladder fossa and anterior to the liver, similar to yesterday's CT  scan. 2. Similar appearance of fluid collections in the gallbladder fossa, anterior to the liver, and along the proximal stomach. No substantial increase in any of these collections in the interval. If there is clinical concern for bile leak, nuclear scintigraphy may prove helpful to further evaluate 3. Hepatic steatosis. Electronically Signed   By: Misty Stanley M.D.   On: 01/27/2021 14:39   DG Abd Portable 2V  Result Date: 01/28/2021 CLINICAL DATA:  Abdominal pain.  Recent cholecystectomy EXAM: PORTABLE ABDOMEN - 2 VIEW COMPARISON:  Abdominal MRI/MRCP Jan 27, 2021 FINDINGS: Supine and upright images obtained. Postoperative change noted right mid abdomen. No demonstrable bowel dilatation or air-fluid levels to suggest bowel obstruction. No free air. Small amount of free air is likely of postoperative etiology. There is atelectasis in the right lung base. IMPRESSION: No bowel dilatation or air-fluid level suggesting bowel obstruction. Postoperative change right abdomen. Small amount of free air is likely of postoperative etiology. This finding warrants close clinical and imaging assessment. Electronically Signed   By: Lowella Grip III M.D.   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 He also reports worsening of his chronic left shoulder pain since his surgery.  Abdominal pain, leukocytosis, lactic acidosis, low-grade fever -status post elective laparoscopic cholecystectomy on 4/25 with robotic lysis of  adhesions on 4/25 - started on Zosyn on admission, blood culture negative -MRCP per general surgery, appreciate general surgery input, continue hydration, diet ordered per general surgery  Mild elevation of LFT, likely postop findings Hyperbilirubinemia Resume Lipitor --  HIDA scan per Surgery today to look for biliary leak  CKD 2 -Baseline creatinine 1.1, creatinine today is 1.3 -Repeat BMP in the morning --will resume lisinopril and cont to monitor BMP  Acute on Chronic left shoulder pain -Pain control  CAD (coronary artery disease) -No complaints of chest pain. EKG nonacute --Resume aspirin and Lipitor on start on  diet  History of diaphragm paralysis -No acute concerns at this time  Essential hypertension -Hydralazine IV as needed -- resume lisinopril today  Procedures: Family communication :pt tells me his family is informed by him Consults :Gen surgery CODE STATUS: full DVT Prophylaxis :lovenox Level of care: Med-Surg Status is: Inpatient  Remains inpatient appropriate because:Ongoing active pain requiring inpatient pain management   Dispo: The patient is from: Home              Anticipated d/c is to: Home              Patient currently is not medically stable to d/c.   Difficult to place patient No   Patient has HIDA scan pending. Will await results of it in discussed with surgery regarding discharge plan hopefully in 1 to 2 days.     TOTAL TIME TAKING CARE OF THIS PATIENT: 25 minutes.  >50% time spent on counselling and coordination of care  Note: This dictation was prepared with Dragon dictation along with smaller phrase technology. Any transcriptional errors that result from this process are unintentional.  Fritzi Mandes M.D    [facility]   CC: Primary care physician; Stanford Breed, PA-CPatient ID: Jack Shaw, male   DOB: 09-Dec-1960, 60 y.o.   MRN: [id number]

## 2021-01-28 NOTE — Plan of Care (Signed)
  Problem: Clinical Measurements: Goal: Cardiovascular complication will be avoided Outcome: Progressing   Problem: Activity: Goal: Risk for activity intolerance will decrease Outcome: Progressing   Problem: Elimination: Goal: Will not experience complications related to bowel motility Outcome: Progressing   Problem: Pain Managment: Goal: General experience of comfort will improve Outcome: Progressing   Problem: Safety: Goal: Ability to remain free from injury will improve Outcome: Progressing

## 2021-01-28 NOTE — Progress Notes (Signed)
[facility] Hospital Day(s): 2.   Interval History:  Patient seen and examined No acute events or new complaints overnight.  Patient reports he is feeling better this morning compared to prior days He reports his abdominal pain is improved but still having left shoulder discomfort No fever, chills, nausea, emesis His leukocytosis resolved; now 8.8K Remains with AKI; sCr - 1.35; UO - unmeasured No electrolyte derangements He continues to have intermittent lactic acidosis; 2.1 Continues to have increasing hyperbilirubinemia to 4.1 with direct being 2.3; MRCP on 05/01 was without choledocholithiasis  He is currently on full liquid diet; tolerating well He is having bowel function   Vital signs in last 24 hours: [min-max] current  Temp:  [98.5 F (36.9 C)-99.9 F (37.7 C)] 98.5 F (36.9 C) (05/02 0431) Pulse Rate:  [95-110] 110 (05/02 0431) Resp:  [16-18] 18 (05/02 0431) BP: (134-152)/(89-94) 152/89 (05/02 0431) SpO2:  [94 %-97 %] 96 % (05/02 0431)     Height: 6' (182.9 cm) Weight: 112.2 kg BMI (Calculated): 33.54   Intake/Output last 2 shifts:  05/01 0701 - 05/02 0700 In: 926.6 [P.O.:480; I.V.:397; IV Piggyback:49.6] Out: -    Physical Exam:  Constitutional: alert, cooperative and no distress  Respiratory: breathing non-labored at rest  Cardiovascular: regular rate and sinus rhythm  Gastrointestinal: Soft, non-tender, and non-distended, no rebound/guarding Integumentary: Laparoscopic incisions are CDI with dermabond, no erythema or drainage   Labs:  CBC Latest Ref Rng & Units 01/28/2021 01/27/2021 01/26/2021  WBC 4.0 - 10.5 K/uL 8.8 12.4(H) 17.3(H)  Hemoglobin 13.0 - 17.0 g/dL 13.6 12.8(L) 15.0  Hematocrit 39.0 - 52.0 % 40.5 39.6 46.1  Platelets 150 - 400 K/uL 193 175 211   CMP Latest Ref Rng & Units 01/28/2021 01/27/2021 01/26/2021  Glucose 70 - 99 mg/dL 133(H) 111(H) 148(H)  BUN 8 - 23 mg/dL 26(H) 18 14  Creatinine 0.61 - 1.24 mg/dL  1.35(H) 1.30(H) 1.13  Sodium 135 - 145 mmol/L 140 138 138  Potassium 3.5 - 5.1 mmol/L 3.8 4.2 4.0  Chloride 98 - 111 mmol/L 103 104 103  CO2 22 - 32 mmol/L 26 27 26   Calcium 8.9 - 10.3 mg/dL 8.7(L) 8.5(L) 9.0  Total Protein 6.5 - 8.1 g/dL 6.2(L) 5.9(L) 7.2  Total Bilirubin 0.3 - 1.2 mg/dL 4.1(H) 3.3(H) 3.3(H)  Alkaline Phos 38 - 126 U/L 34(L) 31(L) 33(L)  AST 15 - 41 U/L 23 29 49(H)  ALT 0 - 44 U/L 38 49(H) 75(H)     Imaging studies:   KUB (01/28/2021) personally reviewed showing mild air-fluid level in the stomach on upright film but there remains air throughout the colon without small bowel dilation, and radiologist report pending   Assessment/Plan: 60 y.o. male with worsening hyperbilirubinemia (MRCP negative) 4 days s/p robotic assisted laparoscopic cholecystectomy for biliary colic    - Given persistent worsening of hyperbilirubinemia, will obtain HIDA scan today for Biliary leak rule out    - He will need to likely be NPO for HIDA   - Continue IVF resuscitation; monitor renal function  - Monitor abdominal examination; on-going bowel function  - Pain control prn; antiemetics prn   - Mobilization as tolerated  - Further management per primary team; we will follow    All of the above findings and recommendations were discussed with the patient, and the medical team, and all of patient's questions were answered to his expressed satisfaction.  -- Edison Simon, PA-C [facility] 01/28/2021, 7:27 AM [phone] M-F: 7am -  4pm

## 2021-01-28 NOTE — Progress Notes (Signed)
Patient due for next set of MEWS VS. Patient off floor for test. Will take VS once patient returns.   Fuller Mandril, RN

## 2021-01-28 NOTE — Progress Notes (Signed)
Informed Dr. Damita Dunnings re: critical lactic of 2.0 this AM.

## 2021-01-28 NOTE — Progress Notes (Addendum)
HIDA, CT and MRCP all reviewed w Dr. Laqueta Carina. C/W small bile leak. No evidence of CBD transection ir major injuries.  Plan for ERCP. D/W Dr. Allen Norris and Dr. Bonna Gains. Family and pt updated. We will hold lovenox prior to procedure.

## 2021-01-29 ENCOUNTER — Inpatient Hospital Stay: Payer: BLUE CROSS/BLUE SHIELD | Admitting: Certified Registered Nurse Anesthetist

## 2021-01-29 ENCOUNTER — Encounter: Payer: Self-pay | Admitting: Internal Medicine

## 2021-01-29 ENCOUNTER — Encounter
Admission: EM | Disposition: A | Discharge status: Home / Self Care | Payer: Self-pay | Source: Home / Self Care | Attending: Internal Medicine

## 2021-01-29 ENCOUNTER — Inpatient Hospital Stay: Payer: BLUE CROSS/BLUE SHIELD

## 2021-01-29 DIAGNOSIS — G8929 Other chronic pain: Secondary | ICD-10-CM

## 2021-01-29 DIAGNOSIS — M25512 Pain in left shoulder: Secondary | ICD-10-CM

## 2021-01-29 DIAGNOSIS — I10 Essential (primary) hypertension: Secondary | ICD-10-CM

## 2021-01-29 DIAGNOSIS — K838 Other specified diseases of biliary tract: Secondary | ICD-10-CM

## 2021-01-29 HISTORY — PX: ERCP: SHX5425

## 2021-01-29 SURGERY — ERCP, WITH INTERVENTION IF INDICATED
Anesthesia: General

## 2021-01-29 MED ORDER — ONDANSETRON HCL 4 MG/2ML IJ SOLN: 4.0000 mg | Freq: Once | INTRAMUSCULAR | Status: DC | PRN

## 2021-01-29 MED ORDER — ROCURONIUM BROMIDE 10 MG/ML (PF) SYRINGE
PREFILLED_SYRINGE | INTRAVENOUS | Status: AC
  Filled 2021-01-29: qty 10

## 2021-01-29 MED ORDER — FENTANYL CITRATE (PF) 100 MCG/2ML IJ SOLN: 25.0000 ug | INTRAMUSCULAR | Status: DC | PRN

## 2021-01-29 MED ORDER — FENTANYL CITRATE (PF) 100 MCG/2ML IJ SOLN
INTRAMUSCULAR | Status: AC
  Filled 2021-01-29: qty 2

## 2021-01-29 MED ORDER — ROCURONIUM BROMIDE 100 MG/10ML IV SOLN
INTRAVENOUS | Status: DC | PRN
  Administered 2021-01-29: 5 mg via INTRAVENOUS
  Administered 2021-01-29: 25 mg via INTRAVENOUS

## 2021-01-29 MED ORDER — DEXAMETHASONE SODIUM PHOSPHATE 10 MG/ML IJ SOLN
INTRAMUSCULAR | Status: AC
  Filled 2021-01-29: qty 1

## 2021-01-29 MED ORDER — ALBUTEROL SULFATE HFA 108 (90 BASE) MCG/ACT IN AERS
INHALATION_SPRAY | RESPIRATORY_TRACT | Status: DC | PRN
  Administered 2021-01-29: 2 via RESPIRATORY_TRACT

## 2021-01-29 MED ORDER — INDOMETHACIN 50 MG RE SUPP
RECTAL | Status: AC
  Administered 2021-01-29: 100 mg via RECTAL
  Filled 2021-01-29: qty 1

## 2021-01-29 MED ORDER — MIDAZOLAM HCL 2 MG/2ML IJ SOLN
INTRAMUSCULAR | Status: AC
  Filled 2021-01-29: qty 2

## 2021-01-29 MED ORDER — PROPOFOL 10 MG/ML IV BOLUS
INTRAVENOUS | Status: DC | PRN
  Administered 2021-01-29: 200 mg via INTRAVENOUS

## 2021-01-29 MED ORDER — ONDANSETRON HCL 4 MG/2ML IJ SOLN
INTRAMUSCULAR | Status: DC | PRN
  Administered 2021-01-29: 4 mg via INTRAVENOUS

## 2021-01-29 MED ORDER — SUGAMMADEX SODIUM 200 MG/2ML IV SOLN
INTRAVENOUS | Status: DC | PRN
  Administered 2021-01-29: 240 mg via INTRAVENOUS

## 2021-01-29 MED ORDER — FENTANYL CITRATE (PF) 100 MCG/2ML IJ SOLN
INTRAMUSCULAR | Status: DC | PRN
  Administered 2021-01-29: 50 ug via INTRAVENOUS

## 2021-01-29 MED ORDER — IPRATROPIUM-ALBUTEROL 0.5-2.5 (3) MG/3ML IN SOLN
RESPIRATORY_TRACT | Status: AC
  Filled 2021-01-29: qty 3

## 2021-01-29 MED ORDER — MIDAZOLAM HCL 2 MG/2ML IJ SOLN
INTRAMUSCULAR | Status: DC | PRN
  Administered 2021-01-29: 2 mg via INTRAVENOUS

## 2021-01-29 MED ORDER — LIDOCAINE HCL (PF) 2 % IJ SOLN
INTRAMUSCULAR | Status: AC
  Filled 2021-01-29: qty 5

## 2021-01-29 MED ORDER — SUCCINYLCHOLINE CHLORIDE 20 MG/ML IJ SOLN
INTRAMUSCULAR | Status: DC | PRN
  Administered 2021-01-29: 120 mg via INTRAVENOUS

## 2021-01-29 MED ORDER — SUCCINYLCHOLINE CHLORIDE 200 MG/10ML IV SOSY
PREFILLED_SYRINGE | INTRAVENOUS | Status: AC
  Filled 2021-01-29: qty 10

## 2021-01-29 MED ORDER — LIDOCAINE HCL (CARDIAC) PF 100 MG/5ML IV SOSY
PREFILLED_SYRINGE | INTRAVENOUS | Status: DC | PRN
  Administered 2021-01-29: 100 mg via INTRAVENOUS

## 2021-01-29 MED ORDER — ALBUTEROL SULFATE HFA 108 (90 BASE) MCG/ACT IN AERS
INHALATION_SPRAY | RESPIRATORY_TRACT | Status: AC
  Filled 2021-01-29: qty 6.7

## 2021-01-29 MED ORDER — DEXAMETHASONE SODIUM PHOSPHATE 10 MG/ML IJ SOLN
INTRAMUSCULAR | Status: DC | PRN
  Administered 2021-01-29: 10 mg via INTRAVENOUS

## 2021-01-29 MED ORDER — ONDANSETRON HCL 4 MG/2ML IJ SOLN
INTRAMUSCULAR | Status: AC
  Filled 2021-01-29: qty 2

## 2021-01-29 MED ORDER — IPRATROPIUM-ALBUTEROL 0.5-2.5 (3) MG/3ML IN SOLN
3.0000 mL | Freq: Once | RESPIRATORY_TRACT | Status: AC
  Administered 2021-01-29: 3 mL via RESPIRATORY_TRACT

## 2021-01-29 MED ORDER — INDOMETHACIN 50 MG RE SUPP: 100.0000 mg | Freq: Once | RECTAL | Status: AC

## 2021-01-29 MED ORDER — SODIUM CHLORIDE 0.9 % IV SOLN: INTRAVENOUS | Status: DC

## 2021-01-29 MED ORDER — PROPOFOL 10 MG/ML IV BOLUS
INTRAVENOUS | Status: AC
  Filled 2021-01-29: qty 20

## 2021-01-29 NOTE — Progress Notes (Signed)
[place] Hospital Day(s): 3.  Interval History:  Patient seen and examined No acute events or new complaints overnight.  Patient still with mostly left should discomfort, some RUQ "burning" pain but this is mild No fever, chills, nausea, emesis No new labs this morning He did undergo HIDA scan yesterday (05/02) which was concerning for bile leak, but CBD patent  He had been tolerating a diet to this point; having bowel function but currently NPO Plan for ERCP today with Dr Allen Norris  Vital signs in last 24 hours: [min-max] current  Temp:  [98.1 F (36.7 C)-99.4 F (37.4 C)] 98.1 F (36.7 C) (05/03 0641) Pulse Rate:  [91-112] 93 (05/03 0641) Resp:  [18-24] 20 (05/03 0641) BP: (140-164)/(84-101) 143/92 (05/03 0641) SpO2:  [90 %-96 %] 95 % (05/03 0641)     Height: 6' (182.9 cm) Weight: 112.2 kg BMI (Calculated): 33.54   Intake/Output last 2 shifts:  05/02 0701 - 05/03 0700 In: 3748.2 [P.O.:300; I.V.:1777; IV Piggyback:1671.2] Out: 200 [Urine:200]   Physical Exam:  Constitutional: alert, cooperative and no distress  Respiratory: breathing non-labored at rest  Cardiovascular: regular rate and sinus rhythm  Gastrointestinal: Soft, non-tender, and non-distended, no rebound/guarding Integumentary: Laparoscopic incisions are CDI with dermabond, no erythema or drainage   Labs:  CBC Latest Ref Rng & Units 01/28/2021 01/27/2021 01/26/2021  WBC 4.0 - 10.5 K/uL 8.8 12.4(H) 17.3(H)  Hemoglobin 13.0 - 17.0 g/dL 13.6 12.8(L) 15.0  Hematocrit 39.0 - 52.0 % 40.5 39.6 46.1  Platelets 150 - 400 K/uL 193 175 211   CMP Latest Ref Rng & Units 01/28/2021 01/27/2021 01/26/2021  Glucose 70 - 99 mg/dL 133(H) 111(H) 148(H)  BUN 8 - 23 mg/dL 26(H) 18 14  Creatinine 0.61 - 1.24 mg/dL 1.35(H) 1.30(H) 1.13  Sodium 135 - 145 mmol/L 140 138 138  Potassium 3.5 - 5.1 mmol/L 3.8 4.2 4.0  Chloride 98 - 111 mmol/L 103 104 103  CO2 22 - 32 mmol/L 26 27 26   Calcium 8.9 - 10.3  mg/dL 8.7(L) 8.5(L) 9.0  Total Protein 6.5 - 8.1 g/dL 6.2(L) 5.9(L) 7.2  Total Bilirubin 0.3 - 1.2 mg/dL 4.1(H) 3.3(H) 3.3(H)  Alkaline Phos 38 - 126 U/L 34(L) 31(L) 33(L)  AST 15 - 41 U/L 23 29 49(H)  ALT 0 - 44 U/L 38 49(H) 75(H)     Imaging studies: No new pertinent imaging studies   Assessment/Plan:  60 y.o. male found to have bile leak 5 days s/p robotic assisted laparoscopic cholecystectomy for biliary colic    - Appreciate GI appreciate assistance; plan for ERCP today   - NPO for procedure; should be okay to resume CLD after and ADAT   - IVF resuscitation this morning   - Monitor abdominal examination; on-going bowel function  - Pain control prn; antiemetics prn   - Monitor hyperbilirubinemia; trend    - Mobilization as tolerated             - Further management per primary team; we will follow    All of the above findings and recommendations were discussed with the patient, and the medical team, and all of patient's questions were answered to his expressed satisfaction.  -- Edison Simon, PA-C [facility] 01/29/2021, 7:15 AM [phone] M-F: 7am - 4pm

## 2021-01-29 NOTE — Anesthesia Preprocedure Evaluation (Addendum)
Anesthesia Evaluation  Patient identified by MRN, date of birth, ID band Patient awake    Reviewed: Allergy & Precautions, NPO status , Patient's Chart, lab work & pertinent test results  History of Anesthesia Complications Negative for: history of anesthetic complications  Airway Mallampati: II       Dental   Pulmonary shortness of breath and with exertion, asthma (exercise induced, no inhalers) , sleep apnea (not using CPAP) , Not current smoker,  R hemidiaphragm paralysis          Cardiovascular hypertension, Pt. on medications + angina (-) Past MI and (-) CHF (-) dysrhythmias (-) Valvular Problems/Murmurs     Neuro/Psych neg Seizures Anxiety  Neuromuscular disease    GI/Hepatic Neg liver ROS, neg GERD  ,  Endo/Other  neg diabetes  Renal/GU Renal InsufficiencyRenal disease     Musculoskeletal   Abdominal   Peds  Hematology   Anesthesia Other Findings Past Medical History: No date: Asthma No date: BPH (benign prostatic hyperplasia) No date: Chronic kidney disease No date: Dizziness No date: History of kidney stones No date: Hyperlipidemia No date: Hypertension No date: NAION (non-arteritic anterior ischemic optic neuropathy),  right No date: Pneumonia No date: Shortness of breath No date: Sleep apnea No date: Tinnitus No date: Vascular spasm (HCC) No date: Weight loss  Reproductive/Obstetrics                            Anesthesia Physical  Anesthesia Plan  ASA: III  Anesthesia Plan: General   Post-op Pain Management:    Induction: Intravenous  PONV Risk Score and Plan: 2 and Ondansetron and Dexamethasone  Airway Management Planned: Oral ETT  Additional Equipment:   Intra-op Plan:   Post-operative Plan: Extubation in OR  Informed Consent: I have reviewed the patients History and Physical, chart, labs and discussed the procedure including the risks, benefits and  alternatives for the proposed anesthesia with the patient or authorized representative who has indicated his/her understanding and acceptance.       Plan Discussed with:   Anesthesia Plan Comments:         Anesthesia Quick Evaluation

## 2021-01-29 NOTE — Anesthesia Procedure Notes (Signed)
Procedure Name: Intubation Date/Time: 01/29/2021 9:41 AM Performed by: Johnna Acosta, CRNA Pre-anesthesia Checklist: Patient identified, Emergency Drugs available, Suction available, Patient being monitored and Timeout performed Patient Re-evaluated:Patient Re-evaluated prior to induction Oxygen Delivery Method: Circle system utilized Preoxygenation: Pre-oxygenation with 100% oxygen Induction Type: IV induction Ventilation: Two handed mask ventilation required Laryngoscope Size: McGraph and 3 Grade View: Grade I Tube type: Oral Number of attempts: 1 Airway Equipment and Method: Stylet,  Video-laryngoscopy and Oral airway Placement Confirmation: ETT inserted through vocal cords under direct vision,  positive ETCO2 and breath sounds checked- equal and bilateral Secured at: 22 cm Tube secured with: Tape Dental Injury: Teeth and Oropharynx as per pre-operative assessment  Difficulty Due To: Difficulty was anticipated, Difficult Airway- due to limited oral opening and Difficult Airway- due to large tongue Future Recommendations: Recommend- induction with short-acting agent, and alternative techniques readily available

## 2021-01-29 NOTE — Plan of Care (Signed)
  Problem: Nutrition: Goal: Adequate nutrition will be maintained Outcome: Progressing   Problem: Coping: Goal: Level of anxiety will decrease Outcome: Progressing   Problem: Elimination: Goal: Will not experience complications related to bowel motility Outcome: Progressing   Problem: Pain Managment: Goal: General experience of comfort will improve Outcome: Progressing   Problem: Safety: Goal: Ability to remain free from injury will improve Outcome: Progressing   

## 2021-01-29 NOTE — Progress Notes (Signed)
[facility] NAME: Jack Shaw    MR#:  [id number]  DATE OF BIRTH:  1961-09-07  SUBJECTIVE:  seen Post ERCP. Tolerating clear liquid diet. Denies any abdominal pain.  REVIEW OF SYSTEMS:   Review of Systems  Constitutional: Negative for chills, fever and weight loss.  HENT: Negative for ear discharge, ear pain and nosebleeds.   Eyes: Negative for blurred vision, pain and discharge.  Respiratory: Negative for sputum production, shortness of breath, wheezing and stridor.   Cardiovascular: Negative for chest pain, palpitations, orthopnea and PND.  Gastrointestinal: Negative for abdominal pain, diarrhea, nausea and vomiting.  Genitourinary: Negative for frequency and urgency.  Musculoskeletal: Positive for joint pain. Negative for back pain.  Neurological: Negative for sensory change, speech change, focal weakness and weakness.  Psychiatric/Behavioral: Negative for depression and hallucinations. The patient is not nervous/anxious.    Tolerating Diet: clear liquid diet Tolerating PT: not needed  DRUG ALLERGIES:   Allergies  Allergen Reactions  . Codeine Itching    Other reaction(s): psychological reaction, Unable to Obtain  . Hydrocodone Itching and Rash  . Oxycodone Itching and Rash  . Diphenoxylate-Atropine Rash    Diffuse with persistent itching    VITALS:  Blood pressure (!) 140/94, pulse 99, temperature 98.7 F (37.1 C), temperature source Oral, resp. rate 16, height 6' (1.829 m), weight 112.2 kg, SpO2 92 %.  PHYSICAL EXAMINATION:   Physical Exam  GENERAL:  60 y.o.-year-old patient lying in the bed with no acute distress.  LUNGS: Normal breath sounds bilaterally, no wheezing, rales, rhonchi. No use of accessory muscles of respiration.  CARDIOVASCULAR: S1, S2 normal. No murmurs, rubs, or gallops.  ABDOMEN: Soft, nontender, nondistended. Bowel sounds present. No organomegaly or mass.  EXTREMITIES: No cyanosis,  clubbing or edema b/l.    NEUROLOGIC: Cranial nerves II through XII are intact. No focal Motor or sensory deficits b/l.   PSYCHIATRIC:  patient is alert and oriented x 3.  SKIN: No obvious rash, lesion, or ulcer.   LABORATORY PANEL:  CBC Recent Labs  Lab 01/28/21 0444  WBC 8.8  HGB 13.6  HCT 40.5  PLT 193    Chemistries  Recent Labs  Lab 01/28/21 0444  NA 140  K 3.8  CL 103  CO2 26  GLUCOSE 133*  BUN 26*  CREATININE 1.35*  CALCIUM 8.7*  MG 2.0  AST 23  ALT 38  ALKPHOS 34*  BILITOT 4.1*   Cardiac Enzymes No results for input(s): TROPONINI in the last 168 hours. RADIOLOGY:  DG Abd Portable 2V  Result Date: 01/28/2021 CLINICAL DATA:  Abdominal pain.  Recent cholecystectomy EXAM: PORTABLE ABDOMEN - 2 VIEW COMPARISON:  Abdominal MRI/MRCP Jan 27, 2021 FINDINGS: Supine and upright images obtained. Postoperative change noted right mid abdomen. No demonstrable bowel dilatation or air-fluid levels to suggest bowel obstruction. No free air. Small amount of free air is likely of postoperative etiology. There is atelectasis in the right lung base. IMPRESSION: No bowel dilatation or air-fluid level suggesting bowel obstruction. Postoperative change right abdomen. Small amount of free air is likely of postoperative etiology. This finding warrants close clinical and imaging assessment. Electronically Signed   By: Lowella Grip III M.D.   On: 01/28/2021 09:26   DG C-Arm 1-60 Min-No Report  Result Date: 01/29/2021 Fluoroscopy was utilized by the requesting physician.  No radiographic interpretation.   NM HEPATOBILIARY LEAK (POST-SURGICAL)  Result Date: 01/28/2021 CLINICAL DATA:  Abdominal pain, status post cholecystectomy 01/24/2021,  evaluate for bile leak EXAM: NUCLEAR MEDICINE HEPATOBILIARY IMAGING TECHNIQUE: Sequential images of the abdomen were obtained out to 60 minutes following intravenous administration of radiopharmaceutical. RADIOPHARMACEUTICALS:  7.4 mCi Tc-70m  Choletec IV  COMPARISON:  MR abdomen, 01/27/2021 FINDINGS: Prompt uptake and biliary excretion of activity by the liver is seen. The common bile duct is patent, with visualization of the small bowel at 10 minutes. There is accumulating radiotracer activity in the vicinity of the gallbladder fossa and lesser peritoneal sac throughout the remainder of the examination. IMPRESSION: 1. Accumulating radiotracer activity in the vicinity of the gallbladder fossa and lesser peritoneal sac, consistent with bile leak in the setting of recent cholecystectomy. 2.  Patent common bile duct. Electronically Signed   By: Eddie Candle M.D.   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 He also reports worsening of his chronic left shoulder pain since his surgery.  Abdominal pain, leukocytosis, lactic acidosis, low-grade fever -status post elective laparoscopic cholecystectomy on 4/25 with robotic lysis of adhesions on 4/25 - started on Zosyn on admission, blood culture negative -MRCP per general surgery, appreciate general surgery input, continue hydration, diet ordered per general surgery  Mild elevation of LFT, likely postop findings Hyperbilirubinemia due to Biliary leak s/p ERCP with stent placement --Resume Lipitor --HIDA scan positive for  biliary leak --5/3-- patient underwent ERCP with biliary stent placement by Dr. Allen Norris  CKD 2 -Baseline creatinine 1.1, creatinine today is 1.3 --will resume lisinopril   Acute on Chronic left  shoulder pain -Pain control  CAD (coronary artery disease) -No complaints of chest pain.  --Resume aspirin and Lipitor   Essential hypertension -Hydralazine IV as needed -- resume lisinopril   Procedures: Adhesion lysis for SBO on 4/25, ERCP with stent placement 5/3 Family communication :brother in the room Consults :Gen surgery CODE STATUS: full DVT Prophylaxis :lovenox Level of care: Med-Surg Status is: Inpatient  Remains inpatient appropriate because:Ongoing active pain requiring inpatient pain management   Dispo: The patient is from: Home              Anticipated d/c is to: Home              Patient currently is not medically stable to d/c.   Difficult to place patient No   Patient needs to be monitored for pancreatitis tonight. If remains stable discharged home tomorrow     TOTAL TIME TAKING CARE OF THIS PATIENT: 25 minutes.  >50% time spent on counselling and coordination of care  Note: This dictation was prepared with Dragon dictation along with smaller phrase technology. Any transcriptional errors that result from this process are unintentional.  Fritzi Mandes M.D    [facility]   CC: Primary care physician; Stanford Breed, PA-CPatient ID: Jack Shaw, male   DOB: 12-05-1960, 60 y.o.   MRN: [id number]

## 2021-01-29 NOTE — Anesthesia Postprocedure Evaluation (Signed)
Anesthesia Post Note  Patient: Jack Shaw  Procedure(s) Performed: ENDOSCOPIC RETROGRADE CHOLANGIOPANCREATOGRAPHY (ERCP) (N/A )  Patient location during evaluation: PACU Anesthesia Type: General Level of consciousness: awake and alert and oriented Pain management: pain level controlled Vital Signs Assessment: post-procedure vital signs reviewed and stable Respiratory status: spontaneous breathing Cardiovascular status: blood pressure returned to baseline Anesthetic complications: no   No complications documented.   Last Vitals:  Vitals:   01/29/21 1100 01/29/21 1157  BP: 129/84 (!) 140/94  Pulse: (!) 101 99  Resp: 16 16  Temp:  37.1 C  SpO2: 97% 92%    Last Pain:  Vitals:   01/29/21 1157  TempSrc: Oral  PainSc: 4                  Nakari Bracknell

## 2021-01-29 NOTE — Op Note (Signed)
[facility] Patient Name: Jack Shaw Procedure Date: 01/29/2021 9:02 AM MRN: [id number] Account #: [id number] Date of Birth: July 16, 1961 Admit Type: Inpatient Age: 60 Room: [facility] ENDO ROOM 4 Gender: Male Note Status: Finalized Procedure:             ERCP Indications:           Bile leak Providers:             Lucilla Lame MD, MD Referring MD:          Lenora Boys, PA Medicines:             General Anesthesia Complications:         No immediate complications. Procedure:             Pre-Anesthesia Assessment:                        - Prior to the procedure, a History and Physical was                         performed, and patient medications and allergies were                         reviewed. The patient's tolerance of previous                         anesthesia was also reviewed. The risks and benefits                         of the procedure and the sedation options and risks                         were discussed with the patient. All questions were                         answered, and informed consent was obtained. Prior                         Anticoagulants: The patient has taken no previous                         anticoagulant or antiplatelet agents. ASA Grade                         Assessment: II - A patient with mild systemic disease.                         After reviewing the risks and benefits, the patient                         was deemed in satisfactory condition to undergo the                         procedure.                        After obtaining informed consent, the scope was passed                         under direct  vision. Throughout the procedure, the                         patient's blood pressure, pulse, and oxygen                         saturations were monitored continuously. The [employer] D single use duodenoscope was                         introduced through the  mouth, and used to inject                         contrast into and used to inject contrast into the                         bile duct. The ERCP was accomplished without                         difficulty. The patient tolerated the procedure well. Findings:      The scout film was normal. The esophagus was successfully intubated       under direct vision. The scope was advanced to a normal major papilla in       the descending duodenum without detailed examination of the pharynx,       larynx and associated structures, and upper GI tract. The upper GI tract       was grossly normal. The bile duct was deeply cannulated with the       short-nosed traction sphincterotome. Contrast was injected. I personally       interpreted the bile duct images. There was brisk flow of contrast       through the ducts. Image quality was excellent. Contrast extended to the       entire biliary tree. Extravasation of contrast originating from the left       intrahepatic branches was observed. A wire was passed into the biliary       tree. A 5 mm biliary sphincterotomy was made with a traction (standard)       sphincterotome using ERBE electrocautery. There was no       post-sphincterotomy bleeding. One 10 Fr by 7 cm plastic stent with a       single external flap and a single internal flap was placed 5 cm into the       common bile duct. Bile flowed through the stent. The stent was in good       position. Impression:            - A bile leak was found.                        - A biliary sphincterotomy was performed.                        - One plastic stent was placed into the common bile                         duct. Recommendation:        -  Return patient to hospital ward for ongoing care.                        - Clear liquid diet today.                        - Watch for pancreatitis, bleeding, perforation, and                         cholangitis.                        - Repeat ERCP in 3 months to  remove stent. Procedure Code(s):     --- Professional ---                        [phone], Endoscopic retrograde cholangiopancreatography                         (ERCP); with placement of endoscopic stent into                         biliary or pancreatic duct, including pre- and                         post-dilation and guide wire passage, when performed,                         including sphincterotomy, when performed, each stent                        [id number], Endoscopic catheterization of the biliary                         ductal system, radiological supervision and                         interpretation Diagnosis Code(s):     --- Professional ---                        K83.9, Disease of biliary tract, unspecified                        K83.8, Other specified diseases of biliary tract CPT copyright 2019 [employer]. All rights reserved. The codes documented in this report are preliminary and upon coder review may  be revised to meet current compliance requirements. Lucilla Lame MD, MD 01/29/2021 10:14:41 AM This report has been signed electronically. Number of Addenda: 0 Note Initiated On: 01/29/2021 9:02 AM Estimated Blood Loss:  Estimated blood loss: none.      [facility]

## 2021-01-29 NOTE — Transfer of Care (Signed)
Immediate Anesthesia Transfer of Care Note  Patient: Jack Shaw  Procedure(s) Performed: ENDOSCOPIC RETROGRADE CHOLANGIOPANCREATOGRAPHY (ERCP) (N/A )  Patient Location: PACU  Anesthesia Type:General  Level of Consciousness: drowsy  Airway & Oxygen Therapy: Patient Spontanous Breathing and Patient connected to face mask oxygen  Post-op Assessment: Report given to RN and Post -op Vital signs reviewed and stable  Post vital signs: Reviewed and stable  Last Vitals:  Vitals Value Taken Time  BP    Temp    Pulse 109 01/29/21 1026  Resp 20 01/29/21 1026  SpO2 100 % 01/29/21 1026    Last Pain:  Vitals:   01/29/21 0845  TempSrc: Oral  PainSc:       Patients Stated Pain Goal: 2 (00/76/22 6333)  Complications: No complications documented.

## 2021-01-30 ENCOUNTER — Encounter: Payer: Self-pay | Admitting: Internal Medicine

## 2021-01-30 ENCOUNTER — Inpatient Hospital Stay: Payer: BLUE CROSS/BLUE SHIELD

## 2021-01-30 LAB — LIPASE, BLOOD: Lipase: 41 U/L (ref 11–51)

## 2021-01-30 LAB — COMPREHENSIVE METABOLIC PANEL
ALT: 24 U/L (ref 0–44)
AST: 26 U/L (ref 15–41)
Albumin: 2.5 g/dL — ABNORMAL LOW (ref 3.5–5.0)
Alkaline Phosphatase: 44 U/L (ref 38–126)
Anion gap: 9 (ref 5–15)
BUN: 23 mg/dL (ref 8–23)
CO2: 24 mmol/L (ref 22–32)
Calcium: 8.6 mg/dL — ABNORMAL LOW (ref 8.9–10.3)
Chloride: 106 mmol/L (ref 98–111)
Creatinine, Ser: 1.21 mg/dL (ref 0.61–1.24)
GFR, Estimated: >60 mL/min (ref >60)
Glucose, Bld: 154 mg/dL — ABNORMAL HIGH (ref 70–99)
Potassium: 4.2 mmol/L (ref 3.5–5.1)
Sodium: 139 mmol/L (ref 135–145)
Total Bilirubin: 2.7 mg/dL — ABNORMAL HIGH (ref 0.3–1.2)
Total Protein: 6 g/dL — ABNORMAL LOW (ref 6.5–8.1)

## 2021-01-30 LAB — CBC
HCT: 33.9 % — ABNORMAL LOW (ref 39.0–52.0)
Hemoglobin: 11.2 g/dL — ABNORMAL LOW (ref 13.0–17.0)
MCH: 31 pg (ref 26.0–34.0)
MCHC: 33 g/dL (ref 30.0–36.0)
MCV: 93.9 fL (ref 80.0–100.0)
Platelets: 238 10*3/uL (ref 150–400)
RBC: 3.61 MIL/uL — ABNORMAL LOW (ref 4.22–5.81)
RDW: 13.1 % (ref 11.5–15.5)
WBC: 14.1 10*3/uL — ABNORMAL HIGH (ref 4.0–10.5)
nRBC: 0 % (ref 0.0–0.2)

## 2021-01-30 MED ORDER — VANCOMYCIN HCL 1750 MG/350ML IV SOLN
1750.0000 mg | INTRAVENOUS | Status: DC
  Filled 2021-01-30: qty 350

## 2021-01-30 MED ORDER — PREGABALIN 50 MG PO CAPS
100.0000 mg | ORAL_CAPSULE | Freq: Three times a day (TID) | ORAL | Status: DC
  Administered 2021-01-30 – 2021-01-31 (×4): 100 mg via ORAL
  Filled 2021-01-30 (×4): qty 2

## 2021-01-30 MED ORDER — IOHEXOL 9 MG/ML PO SOLN
500.0000 mL | ORAL | Status: AC
  Administered 2021-01-30 (×2): 500 mL via ORAL

## 2021-01-30 MED ORDER — VANCOMYCIN HCL 2000 MG/400ML IV SOLN
2000.0000 mg | Freq: Once | INTRAVENOUS | Status: AC
  Administered 2021-01-30: 2000 mg via INTRAVENOUS
  Filled 2021-01-30: qty 400

## 2021-01-30 MED ORDER — KETOROLAC TROMETHAMINE 30 MG/ML IJ SOLN
30.0000 mg | Freq: Four times a day (QID) | INTRAMUSCULAR | Status: DC
  Administered 2021-01-30 – 2021-01-31 (×4): 30 mg via INTRAVENOUS
  Filled 2021-01-30 (×4): qty 1

## 2021-01-30 MED ORDER — IOHEXOL 300 MG/ML  SOLN
100.0000 mL | Freq: Once | INTRAMUSCULAR | Status: AC | PRN
  Administered 2021-01-30: 100 mL via INTRAVENOUS

## 2021-01-30 NOTE — Progress Notes (Signed)
Jack Antigua, MD [address] [place], [place], [place], [address] [address], [place], [place], [place], [address] Phone: [phone]  Fax: [phone]   Subjective: Patient denies any abdominal pain.  Tolerating oral diet, no fever or chills no fever or chills   Objective: Exam: Vital signs in last 24 hours: Vitals:   01/30/21 0409 01/30/21 0927 01/30/21 1122 01/30/21 1600  BP: (!) 144/87 (!) 163/92 135/89 139/86  Pulse: 86 (!) 105 98 92  Resp: 16 18 18 18   Temp: 98.1 F (36.7 C) 98.9 F (37.2 C) 98.1 F (36.7 C) 98 F (36.7 C)  TempSrc:  Oral Oral Oral  SpO2: 95% 96% 94% 96%  Weight:      Height:       Weight change:   Intake/Output Summary (Last 24 hours) at 01/30/2021 1607 Last data filed at 01/30/2021 1551 Gross per 24 hour  Intake 740 ml  Output --  Net 740 ml    General: No acute distress, AAO x3 Abd: Soft, NT/ND, No HSM Skin: Warm, no rashes Neck: Supple, Trachea midline   Lab Results: Lab Results  Component Value Date   WBC 14.1 (H) 01/30/2021   HGB 11.2 (L) 01/30/2021   HCT 33.9 (L) 01/30/2021   MCV 93.9 01/30/2021   PLT 238 01/30/2021   Micro Results: Recent Results (from the past 240 hour(s))  SARS CORONAVIRUS 2 (TAT 6-24 HRS) Nasopharyngeal Nasopharyngeal Swab     Status: None   Collection Time: 01/23/21 10:46 AM   Specimen: Nasopharyngeal Swab  Result Value Ref Range Status   SARS Coronavirus 2 NEGATIVE NEGATIVE Final    Comment: (NOTE) SARS-CoV-2 target nucleic acids are NOT DETECTED.  The SARS-CoV-2 RNA is generally detectable in upper and lower respiratory specimens during the acute phase of infection. Negative results do not preclude SARS-CoV-2 infection, do not rule out co-infections with other pathogens, and should not be used as the sole basis for treatment or other patient management decisions. Negative results must be combined with clinical observations, patient history, and epidemiological information. The  expected result is Negative.  Fact Sheet for Patients: SugarRoll.be  Fact Sheet for Healthcare Providers: https://www.woods-mathews.com/  This test is not yet approved or cleared by the [place] FDA and  has been authorized for detection and/or diagnosis of SARS-CoV-2 by FDA under an Emergency Use Authorization (EUA). This EUA will remain  in effect (meaning this test can be used) for the duration of the COVID-19 declaration under Se ction 564(b)(1) of the Act, 21 U.S.C. section 360bbb-3(b)(1), unless the authorization is terminated or revoked sooner.  Performed at [facility], [place] [address], [place], [place] [address]   Culture, blood (routine x 2)     Status: None (Preliminary result)   Collection Time: 01/26/21  8:28 PM   Specimen: BLOOD  Result Value Ref Range Status   Specimen Description BLOOD RIGHT ANTECUBITAL  Final   Special Requests   Final    BOTTLES DRAWN AEROBIC AND ANAEROBIC Blood Culture results may not be optimal due to an inadequate volume of blood received in culture bottles   Culture   Final    NO GROWTH 4 DAYS Performed at [facility], [address] [place], [place] [address]    Report Status PENDING  Incomplete  Culture, blood (routine x 2)     Status: None (Preliminary result)   Collection Time: 01/26/21  8:28 PM   Specimen: BLOOD  Result Value Ref Range Status  Specimen Description BLOOD BLOOD RIGHT FOREARM  Final   Special Requests   Final    BOTTLES DRAWN AEROBIC AND ANAEROBIC Blood Culture adequate volume   Culture   Final    NO GROWTH 4 DAYS Performed at [facility], [address], [place], [place] [address]    Report Status PENDING  Incomplete  Resp Panel by RT-PCR (Flu A&B, Covid) Nasopharyngeal Swab     Status: None   Collection Time: 01/26/21 10:09 PM   Specimen: Nasopharyngeal Swab; Nasopharyngeal(NP) swabs in vial transport medium  Result Value Ref Range  Status   SARS Coronavirus 2 by RT PCR NEGATIVE NEGATIVE Final    Comment: (NOTE) SARS-CoV-2 target nucleic acids are NOT DETECTED.  The SARS-CoV-2 RNA is generally detectable in upper respiratory specimens during the acute phase of infection. The lowest concentration of SARS-CoV-2 viral copies this assay can detect is 138 copies/mL. A negative result does not preclude SARS-Cov-2 infection and should not be used as the sole basis for treatment or other patient management decisions. A negative result may occur with  improper specimen collection/handling, submission of specimen other than nasopharyngeal swab, presence of viral mutation(s) within the areas targeted by this assay, and inadequate number of viral copies(<138 copies/mL). A negative result must be combined with clinical observations, patient history, and epidemiological information. The expected result is Negative.  Fact Sheet for Patients:  EntrepreneurPulse.com.au  Fact Sheet for Healthcare Providers:  IncredibleEmployment.be  This test is no t yet approved or cleared by the [place] FDA and  has been authorized for detection and/or diagnosis of SARS-CoV-2 by FDA under an Emergency Use Authorization (EUA). This EUA will remain  in effect (meaning this test can be used) for the duration of the COVID-19 declaration under Section 564(b)(1) of the Act, 21 U.S.C.section 360bbb-3(b)(1), unless the authorization is terminated  or revoked sooner.       Influenza A by PCR NEGATIVE NEGATIVE Final   Influenza B by PCR NEGATIVE NEGATIVE Final    Comment: (NOTE) The Xpert Xpress SARS-CoV-2/FLU/RSV plus assay is intended as an aid in the diagnosis of influenza from Nasopharyngeal swab specimens and should not be used as a sole basis for treatment. Nasal washings and aspirates are unacceptable for Xpert Xpress SARS-CoV-2/FLU/RSV testing.  Fact Sheet for  Patients: EntrepreneurPulse.com.au  Fact Sheet for Healthcare Providers: IncredibleEmployment.be  This test is not yet approved or cleared by the [place] FDA and has been authorized for detection and/or diagnosis of SARS-CoV-2 by FDA under an Emergency Use Authorization (EUA). This EUA will remain in effect (meaning this test can be used) for the duration of the COVID-19 declaration under Section 564(b)(1) of the Act, 21 U.S.C. section 360bbb-3(b)(1), unless the authorization is terminated or revoked.  Performed at [facility], [address] [place], [place] [address]    Studies/Results: CT ABDOMEN PELVIS W CONTRAST  Result Date: 01/30/2021 CLINICAL DATA:  Cholecystectomy, bile leak, leukocytosis and new onset edema in skin changed right lower quadrant incision site in induration of the abdominal wall EXAM: CT ABDOMEN AND PELVIS WITH CONTRAST TECHNIQUE: Multidetector CT imaging of the abdomen and pelvis was performed using the standard protocol following bolus administration of intravenous contrast. CONTRAST:  146mL OMNIPAQUE IOHEXOL 300 MG/ML SOLN, additional oral enteric contrast COMPARISON:  CT abdomen pelvis, 01/26/2021, MR abdomen, 01/27/2021 FINDINGS: Lower chest: No acute abnormality. Hepatobiliary: No solid liver abnormality is seen. Status post cholecystectomy. There is redemonstrated, heterogeneous fluid in the gallbladder fossa and hepatorenal recess, similar  to prior examination (series 2, image 29). Interval placement of a common bile duct stent, tip positioned in the duodenum. Pancreas: Unremarkable. No pancreatic ductal dilatation or surrounding inflammatory changes. Spleen: Normal in size without significant abnormality. Adrenals/Urinary Tract: Adrenal glands are unremarkable. A small calculus measuring 4 mm has migrated to the right renal pelvis near the ureteropelvic junction (series 2, image 47). No right-sided  hydronephrosis. Bladder is unremarkable. Stomach/Bowel: Stomach is within normal limits. Appendix is surgically absent. No evidence of bowel wall thickening, distention, or inflammatory changes. Sigmoid diverticulosis. Vascular/Lymphatic: No significant vascular findings are present. No enlarged abdominal or pelvic lymph nodes. Reproductive: Urolift implants. Other: No abdominal wall hernia. There is soft tissue edema of the right lateral abdominal wall and flank, significantly increased compared to prior examination (series 2, image 38). There is mild, diffuse anasarca. Small volume perihepatic, perisplenic, and pelvic ascites, similar in volume to prior examination. Musculoskeletal: No acute or significant osseous findings. IMPRESSION: 1. There is soft tissue edema of the right lateral abdominal wall and flank, significantly increased compared to prior examination. Findings are concerning for soft tissue infection in the postoperative setting. 2. There is redemonstrated, heterogeneous fluid in the gallbladder fossa and hepatorenal recess, similar to prior examination, in keeping with known biloma. 3. Interval placement of a common bile duct stent, tip positioned in the duodenum. 4. A small calculus measuring 4 mm has migrated to the right renal pelvis near the ureteropelvic junction. No right-sided hydronephrosis. 5. Small volume perihepatic, perisplenic, and pelvic ascites, similar in volume to prior examination. 6. No discrete fluid collection suggestive of abscess identified in the abdomen or pelvis. Electronically Signed   By: Eddie Candle M.D.   On: 01/30/2021 14:53   DG C-Arm 1-60 Min-No Report  Result Date: 01/29/2021 Fluoroscopy was utilized by the requesting physician.  No radiographic interpretation.   Medications:  Scheduled Meds: . aspirin EC  81 mg Oral Daily  . atorvastatin  80 mg Oral QHS  . ketorolac  30 mg Intravenous Q6H  . metoprolol tartrate  12.5 mg Oral BID  . polyethylene glycol   17 g Oral BID  . pregabalin  100 mg Oral TID   Continuous Infusions: . sodium chloride    . piperacillin-tazobactam (ZOSYN)  IV 3.375 g (01/30/21 1159)  . [START ON 01/31/2021] vancomycin    . vancomycin 2,000 mg (01/30/21 1515)   PRN Meds:.sodium chloride, alum & mag hydroxide-simeth, HYDROcodone-acetaminophen, nitroGLYCERIN, ondansetron **OR** ondansetron (ZOFRAN) IV   Assessment: Principal Problem:   Post-operative abdominal pain Active Problems:   Acute on Chronic left shoulder pain   CAD (coronary artery disease)   Diaphragm paralysis   Essential hypertension   S/P laparoscopic cholecystectomy   Sepsis ([place])   Bile leak from accessory bile duct    Plan: Abdominal pain has completely resolved post ERCP yesterday Total bilirubin has improved from yesterday as well Lipase is normal No evidence of post ERCP pancreatitis Continue to advance diet as per surgery team   LOS: 4 days   Jack Antigua, MD 01/30/2021, 4:07 PM

## 2021-01-30 NOTE — Progress Notes (Signed)
[facility] Day(s): 4.   Post op day(s): 1 Day Post-Op.   Interval History:  Patient seen and examined No acute events or new complaints overnight.  Patient reports he is overall doing well but interestingly has developed somewhat significant edema to the right lateral abdomen near his RLQ incision He denied any abdominal pain, nausea, emesis, fever, chills He does have a bump in his leukocytosis this morning to 14.1K; may be reactive from ERCP yesterday His renal function has been normal, sCr - 1.21; UO is unmeasured No electrolyte derangements Hyperbilirubinemia improved this morning to 2.7 He did have ERCP yesterday with Dr Allen Norris; doing well; lipase normal He was restarted on full liquid diet and tolerating well   Vital signs in last 24 hours: [min-max] current  Temp:  [98.1 F (36.7 C)-98.7 F (37.1 C)] 98.1 F (36.7 C) (05/04 0409) Pulse Rate:  [85-109] 86 (05/04 0409) Resp:  [15-26] 16 (05/04 0409) BP: (129-164)/(82-94) 144/87 (05/04 0409) SpO2:  [92 %-100 %] 95 % (05/04 0409)     Height: 6' (182.9 cm) Weight: 112.2 kg BMI (Calculated): 33.54   Intake/Output last 2 shifts:  05/03 0701 - 05/04 0700 In: 1551.7 [P.O.:960; I.V.:550; IV Piggyback:41.7] Out: 0    Physical Exam:  Constitutional: alert, cooperative and no distress  Respiratory: breathing non-labored at rest  Cardiovascular: regular rate and sinus rhythm  Gastrointestinal:Soft, non-tender, and non-distended, no rebound/guarding Integumentary:Laparoscopic incisions are CDI with dermabond, no erythema or drainage. However, he does have an area of rather significant edema and what appears to be ecchymosis lateral to his RLQ incision, this is no tender, non-blanching, no purulence    Labs:  CBC Latest Ref Rng & Units 01/30/2021 01/28/2021 01/27/2021  WBC 4.0 - 10.5 K/uL 14.1(H) 8.8 12.4(H)  Hemoglobin 13.0 - 17.0 g/dL 11.2(L) 13.6 12.8(L)  Hematocrit 39.0 - 52.0 %  33.9(L) 40.5 39.6  Platelets 150 - 400 K/uL 238 193 175   CMP Latest Ref Rng & Units 01/30/2021 01/28/2021 01/27/2021  Glucose 70 - 99 mg/dL 154(H) 133(H) 111(H)  BUN 8 - 23 mg/dL 23 26(H) 18  Creatinine 0.61 - 1.24 mg/dL 1.21 1.35(H) 1.30(H)  Sodium 135 - 145 mmol/L 139 140 138  Potassium 3.5 - 5.1 mmol/L 4.2 3.8 4.2  Chloride 98 - 111 mmol/L 106 103 104  CO2 22 - 32 mmol/L 24 26 27   Calcium 8.9 - 10.3 mg/dL 8.6(L) 8.7(L) 8.5(L)  Total Protein 6.5 - 8.1 g/dL 6.0(L) 6.2(L) 5.9(L)  Total Bilirubin 0.3 - 1.2 mg/dL 2.7(H) 4.1(H) 3.3(H)  Alkaline Phos 38 - 126 U/L 44 34(L) 31(L)  AST 15 - 41 U/L 26 23 29   ALT 0 - 44 U/L 24 38 49(H)    Imaging studies: No new pertinent imaging studies   Assessment/Plan:  60 y.o. male found to have bile leak, now s/p ERCP on 05/03, who is 6 days s/p robotic assisted laparoscopic cholecystectomy for biliary colic   - Unsure what to make of new onset area of edema to the right lateral abdomen, question hematoma but this would be strange on POD6. Given this new finding and leukocytosis to 14K, I will obtain CT Abdomen/Pelvis to rule out complication    - Monitor abdominal examination; on-going bowel function             - Pain control prn; antiemetics prn              - Monitor hyperbilirubinemia; improving             -  Mobilization as tolerated - Further management per primary team; we will follow     - Discharge Planning: Pending repeat imaging today   All of the above findings and recommendations were discussed with the patient, and the medical team, and all of patient's questions were answered to his expressed satisfaction.  -- Edison Simon, PA-C  [facility] 01/30/2021, 7:33 AM [phone] M-F: 7am - 4pm

## 2021-01-30 NOTE — Progress Notes (Signed)
[place] at [place] NAME: Jack Shaw    MR#:  [id number]  DATE OF BIRTH:  09/06/1961  SUBJECTIVE:  noted swelling over the left upper quadrant since yesterday evening. Patient denies any abdominal pain appears very comfortable. No fever.   Tolerating PO diet. REVIEW OF SYSTEMS:   Review of Systems  Constitutional: Negative for chills, fever and weight loss.  HENT: Negative for ear discharge, ear pain and nosebleeds.   Eyes: Negative for blurred vision, pain and discharge.  Respiratory: Negative for sputum production, shortness of breath, wheezing and stridor.   Cardiovascular: Negative for chest pain, palpitations, orthopnea and PND.  Gastrointestinal: Negative for abdominal pain, diarrhea, nausea and vomiting.  Genitourinary: Negative for frequency and urgency.  Musculoskeletal: Positive for joint pain. Negative for back pain.  Neurological: Negative for sensory change, speech change, focal weakness and weakness.  Psychiatric/Behavioral: Negative for depression and hallucinations. The patient is not nervous/anxious.    Tolerating Diet: soft tolerating PT: not needed  DRUG ALLERGIES:   Allergies  Allergen Reactions  . Codeine Itching    Other reaction(s): psychological reaction, Unable to Obtain  . Hydrocodone Itching and Rash  . Oxycodone Itching and Rash  . Diphenoxylate-Atropine Rash    Diffuse with persistent itching    VITALS:  Blood pressure (!) 163/92, pulse (!) 105, temperature 98.9 F (37.2 C), temperature source Oral, resp. rate 18, height 6' (1.829 m), weight 112.2 kg, SpO2 96 %.  PHYSICAL EXAMINATION:   Physical Exam  GENERAL:  60 y.o.-year-old patient lying in the bed with no acute distress.  LUNGS: Normal breath sounds bilaterally, no wheezing, rales, rhonchi. No use of accessory muscles of respiration.  CARDIOVASCULAR: S1, S2 normal. No murmurs, rubs, or gallops.  ABDOMEN: Soft, nontender, mild distention  left upper quadrant. Nontender. Distended. Bowel sounds present. No organomegaly or mass.  EXTREMITIES: No cyanosis, clubbing or edema b/l.    NEUROLOGIC: Cranial nerves II through XII are intact. No focal Motor or sensory deficits b/l.   PSYCHIATRIC:  patient is alert and oriented x 3.  SKIN: No obvious rash, lesion, or ulcer.   LABORATORY PANEL:  CBC Recent Labs  Lab 01/30/21 0542  WBC 14.1*  HGB 11.2*  HCT 33.9*  PLT 238    Chemistries  Recent Labs  Lab 01/28/21 0444 01/30/21 0542  NA 140 139  K 3.8 4.2  CL 103 106  CO2 26 24  GLUCOSE 133* 154*  BUN 26* 23  CREATININE 1.35* 1.21  CALCIUM 8.7* 8.6*  MG 2.0  --   AST 23 26  ALT 38 24  ALKPHOS 34* 44  BILITOT 4.1* 2.7*   Cardiac Enzymes No results for input(s): TROPONINI in the last 168 hours. RADIOLOGY:  DG C-Arm 1-60 Min-No Report  Result Date: 01/29/2021 Fluoroscopy was utilized by the requesting physician.  No radiographic interpretation.   NM HEPATOBILIARY LEAK (POST-SURGICAL)  Result Date: 01/28/2021 CLINICAL DATA:  Abdominal pain, status post cholecystectomy 01/24/2021, evaluate for bile leak EXAM: NUCLEAR MEDICINE HEPATOBILIARY IMAGING TECHNIQUE: Sequential images of the abdomen were obtained out to 60 minutes following intravenous administration of radiopharmaceutical. RADIOPHARMACEUTICALS:  7.4 mCi Tc-77m  Choletec IV COMPARISON:  MR abdomen, 01/27/2021 FINDINGS: Prompt uptake and biliary excretion of activity by the liver is seen. The common bile duct is patent, with visualization of the small bowel at 10 minutes. There is accumulating radiotracer activity in the vicinity of the gallbladder fossa and lesser peritoneal sac throughout the remainder of  the examination. IMPRESSION: 1. Accumulating radiotracer activity in the vicinity of the gallbladder fossa and lesser peritoneal sac, consistent with bile leak in the setting of recent cholecystectomy. 2.  Patent common bile duct. Electronically Signed   By: Eddie Candle M.D.   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 He also reports worsening of his chronic left shoulder pain since his surgery.  Abdominal pain, leukocytosis, lactic acidosis, low-grade fever right upper quadrant abdominal swelling -status post elective laparoscopic cholecystectomy on 4/25 with robotic lysis of adhesions on 4/25 -- started on Zosyn on admission, blood culture negative -- 5/4--CT abdomen repeated today, report per surgery look stable possible cellulitis and added IV vancomycin  Mild elevation of LFT, likely postop findings Hyperbilirubinemia due to Biliary leak s/p ERCP with stent placement --Resume Lipitor --HIDA scan positive for  biliary leak --5/3-- patient underwent ERCP with biliary stent placement by Dr. Allen Norris --5/4--lipase normal, bilirubin trending down  CKD 2 -Baseline creatinine 1.1, creatinine today is 1.3   Chronic left shoulder pain -Pain control  CAD (coronary artery disease) -No complaints of chest pain.  --cont  aspirin and Lipitor   Essential hypertension -Hydralazine IV as needed -- resume lisinopril   Procedures: Adhesion lysis for SBO on 4/25, ERCP with stent placement 5/3 Family communication :brother in the room Consults :Gen surgery CODE STATUS: full DVT Prophylaxis :lovenox Level of care: Med-Surg Status is: Inpatient  Remains inpatient appropriate because:Ongoing active pain requiring inpatient pain management   Dispo:  The patient is from: Home              Anticipated d/c is to: Home              Patient currently is not medically stable to d/c.   Difficult to place patient No   Patient needs to be monitored for right upper quadrant abdominal swelling. Surgery started patient on IV vancomycin and need to be monitored overnight.    TOTAL TIME TAKING CARE OF THIS PATIENT: 25 minutes.  >50% time spent on counselling and coordination of care  Note: This dictation was prepared with Dragon dictation along with smaller phrase technology. Any transcriptional errors that result from this process are unintentional.  Fritzi Mandes M.D    [facility]   CC: Primary care physician; Stanford Breed, PA-CPatient ID: Jack Shaw, male   DOB: [birth year], 60 y.o.   MRN: [id number]

## 2021-01-30 NOTE — Progress Notes (Signed)
Pharmacy Antibiotic Note  Jack Shaw is a 60 y.o. male admitted on 01/26/2021 with intra-abdominal infection/cellulitis.    Pharmacy has been consulted for Zosyn/Vancomycin dosing.  Plan: Continue Zosyn 3.375 gm IV Q8H EI   Will start Vancomycin 2000mg  IV as a loading dose, followed by:  Vancomycin 1750mg  IV q24h Goal AUC 400-550. Expected AUC: 497.6 SCr used: 1.21  Height: 6' (182.9 cm) Weight: 112.2 kg (247 lb 5.7 oz) IBW/kg (Calculated) : 77.6  Temp (24hrs), Avg:98.4 F (36.9 C), Min:98.1 F (36.7 C), Max:98.9 F (37.2 C)  Recent Labs  Lab 01/26/21 1928 01/26/21 2028 01/26/21 2209 01/27/21 0019 01/27/21 0129 01/27/21 0530 01/28/21 0444 01/30/21 0542  WBC 17.3*  --   --   --   --  12.4* 8.8 14.1*  CREATININE 1.13  --   --   --   --  1.30* 1.35* 1.21  LATICACIDVEN  --  2.1* 1.7 2.1* 1.2  --  2.0*  --     Estimated Creatinine Clearance: 82.9 mL/min (by C-G formula based on SCr of 1.21 mg/dL).    Allergies  Allergen Reactions  . Codeine Itching    Other reaction(s): psychological reaction, Unable to Obtain  . Hydrocodone Itching and Rash  . Oxycodone Itching and Rash  . Diphenoxylate-Atropine Rash    Diffuse with persistent itching    Antimicrobials this admission: Zosyn 4/30 >>  Vancomycin 5/4 >>   Dose adjustments this admission:  Microbiology results: BCx: 4/30 NG4D  Thank you for allowing pharmacy to be a part of this patient's care.  Lu Duffel, PharmD, BCPS Clinical Pharmacist 01/30/2021 2:53 PM

## 2021-01-31 ENCOUNTER — Encounter: Payer: Self-pay | Admitting: Gastroenterology

## 2021-01-31 LAB — CULTURE, BLOOD (ROUTINE X 2)
Culture: NO GROWTH
Culture: NO GROWTH
Special Requests: ADEQUATE

## 2021-01-31 LAB — CBC
HCT: 34.1 % — ABNORMAL LOW (ref 39.0–52.0)
Hemoglobin: 10.9 g/dL — ABNORMAL LOW (ref 13.0–17.0)
MCH: 30.8 pg (ref 26.0–34.0)
MCHC: 32 g/dL (ref 30.0–36.0)
MCV: 96.3 fL (ref 80.0–100.0)
Platelets: 283 10*3/uL (ref 150–400)
RBC: 3.54 MIL/uL — ABNORMAL LOW (ref 4.22–5.81)
RDW: 13.4 % (ref 11.5–15.5)
WBC: 14.6 10*3/uL — ABNORMAL HIGH (ref 4.0–10.5)
nRBC: 0 % (ref 0.0–0.2)

## 2021-01-31 LAB — COMPREHENSIVE METABOLIC PANEL
ALT: 25 U/L (ref 0–44)
AST: 39 U/L (ref 15–41)
Albumin: 2.3 g/dL — ABNORMAL LOW (ref 3.5–5.0)
Alkaline Phosphatase: 64 U/L (ref 38–126)
Anion gap: 7 (ref 5–15)
BUN: 24 mg/dL — ABNORMAL HIGH (ref 8–23)
CO2: 26 mmol/L (ref 22–32)
Calcium: 8.3 mg/dL — ABNORMAL LOW (ref 8.9–10.3)
Chloride: 109 mmol/L (ref 98–111)
Creatinine, Ser: 1.25 mg/dL — ABNORMAL HIGH (ref 0.61–1.24)
GFR, Estimated: >60 mL/min (ref >60)
Glucose, Bld: 133 mg/dL — ABNORMAL HIGH (ref 70–99)
Potassium: 3.9 mmol/L (ref 3.5–5.1)
Sodium: 142 mmol/L (ref 135–145)
Total Bilirubin: 2.4 mg/dL — ABNORMAL HIGH (ref 0.3–1.2)
Total Protein: 5.8 g/dL — ABNORMAL LOW (ref 6.5–8.1)

## 2021-01-31 LAB — LIPASE, BLOOD: Lipase: 38 U/L (ref 11–51)

## 2021-01-31 MED ORDER — SULFAMETHOXAZOLE-TRIMETHOPRIM 800-160 MG PO TABS: 1.0000 | ORAL_TABLET | Freq: Two times a day (BID) | ORAL | 0 refills | Status: AC

## 2021-01-31 MED ORDER — LOPERAMIDE HCL 2 MG PO CAPS: 2.0000 mg | ORAL_CAPSULE | Freq: Once | ORAL | Status: DC

## 2021-01-31 NOTE — Progress Notes (Signed)
[facility] Day(s): 5.   Post op day(s): 2 Days Post-Op.   Interval History:  Patient seen and examined No acute events or new complaints overnight.  Patient reports he is feeling good this morning No significant abdominal pain, nausea, emesis, fever, chills He continues to have stable leukocytosis to 14.6K His renal function seems to remain at his baseline; scr - 1.25; UO - unmeasured No significant electrolyte derangements  Bilirubin level improving to 2.4 this morning He did have repeat CT yesterday given concern for abdominal wall edema, which confirmed this without significant evidence of infection/abscess nor intra-abdominal process He was started on vancomycin and zosyn He continues on soft diet; tolerating well Having intermittent diarrhea  Vital signs in last 24 hours: [min-max] current  Temp:  [98 F (36.7 C)-99.3 F (37.4 C)] 98 F (36.7 C) (05/05 0510) Pulse Rate:  [92-105] 96 (05/05 0510) Resp:  [16-18] 17 (05/05 0510) BP: (106-163)/(75-92) 106/75 (05/05 0510) SpO2:  [94 %-96 %] 95 % (05/05 0510)     Height: 6' (182.9 cm) Weight: 112.2 kg BMI (Calculated): 33.54   Intake/Output last 2 shifts:  05/04 0701 - 05/05 0700 In: 858.4 [P.O.:500; IV Piggyback:358.4] Out: -    Physical Exam:  Constitutional: alert, cooperative and no distress  Respiratory: breathing non-labored at rest  Cardiovascular: regular rate and sinus rhythm  Gastrointestinal:Soft, non-tender, and non-distended, no rebound/guarding Integumentary:Laparoscopic incisions are CDI with dermabond, no erythema or drainage. However, he does have an area of rather significant edema and what appears to be ecchymosis lateral to his RLQ incision, this is no tender, non-blanching, no purulence   Labs:  CBC Latest Ref Rng & Units 01/31/2021 01/30/2021 01/28/2021  WBC 4.0 - 10.5 K/uL 14.6(H) 14.1(H) 8.8  Hemoglobin 13.0 - 17.0 g/dL 10.9(L) 11.2(L) 13.6   Hematocrit 39.0 - 52.0 % 34.1(L) 33.9(L) 40.5  Platelets 150 - 400 K/uL 283 238 193   CMP Latest Ref Rng & Units 01/31/2021 01/30/2021 01/28/2021  Glucose 70 - 99 mg/dL 133(H) 154(H) 133(H)  BUN 8 - 23 mg/dL 24(H) 23 26(H)  Creatinine 0.61 - 1.24 mg/dL 1.25(H) 1.21 1.35(H)  Sodium 135 - 145 mmol/L 142 139 140  Potassium 3.5 - 5.1 mmol/L 3.9 4.2 3.8  Chloride 98 - 111 mmol/L 109 106 103  CO2 22 - 32 mmol/L 26 24 26   Calcium 8.9 - 10.3 mg/dL 8.3(L) 8.6(L) 8.7(L)  Total Protein 6.5 - 8.1 g/dL 5.8(L) 6.0(L) 6.2(L)  Total Bilirubin 0.3 - 1.2 mg/dL 2.4(H) 2.7(H) 4.1(H)  Alkaline Phos 38 - 126 U/L 64 44 34(L)  AST 15 - 41 U/L 39 26 23  ALT 0 - 44 U/L 25 24 38     Imaging studies: No new pertinent imaging studies   Assessment/Plan:  60 y.o. male with found to have bile leak, now s/p ERCP on 05/03, who is 6days s/p robotic assisted laparoscopic cholecystectomy for biliary colic   - Will transition to bactrim BID x10 days for home; reviewed signs/symptoms of worsening infection with patient and his family at home  - Recommend imodium prn for diarrhea   - Monitor abdominal examination; on-going bowel function - Pain control prn; antiemetics prn - Monitor hyperbilirubinemia; improving - Mobilization as tolerated - Further management per primary team               - Discharge Planning: Okay for discharge from surgical perspective; ABx as above, will follow up next week with Dr Dahlia Byes  All of the  above findings and recommendations were discussed with the patient, patient's family (fiance on the phone), and the medical team, and all of patient's and family's questions were answered to their expressed satisfaction.  -- Edison Simon, PA-C [facility] 01/31/2021, 7:38 AM [phone] M-F: 7am - 4pm

## 2021-01-31 NOTE — Progress Notes (Incomplete)
Pt discharged home with his wife.  All discharge instructions provided and questions answered.  VSS and pt without complaints

## 2021-01-31 NOTE — Discharge Summary (Addendum)
[facility] NAME: Jack Shaw    MR#:  [id number]  DATE OF BIRTH:  [birth year]  DATE OF ADMISSION:  01/26/2021 ADMITTING PHYSICIAN: Athena Masse, MD  DATE OF DISCHARGE: 01/31/2021  PRIMARY CARE PHYSICIAN: Stanford Breed, PA-C    ADMISSION DIAGNOSIS:  Dehydration [E86.0] Generalized abdominal pain [R10.84] Post-operative pain [G89.18]  DISCHARGE DIAGNOSIS:  Severe sepsis POA s/p post cholecystectomy abdominal pain with biliary leak status post ERCP with stent placement Right upper quadrant abdominal swelling  SECONDARY DIAGNOSIS:   Past Medical History:  Diagnosis Date  . Asthma   . BPH (benign prostatic hyperplasia)   . Chronic kidney disease   . Dizziness   . History of kidney stones   . Hyperlipidemia   . Hypertension   . NAION (non-arteritic anterior ischemic optic neuropathy), right   . Pneumonia   . Shortness of breath   . Sleep apnea   . Tinnitus   . Vascular spasm ([place])   . 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 He also reports worsening of his chronic left shoulder pain since his surgery.  Severe Sepsis POA Pt came in with Abdominal pain, leukocytosis, lactic acidosis,low-grade fever and found to have biliary leak post cholecystectomy Right upper quadrant abdominal swelling --status post elective laparoscopic cholecystectomy on 4/25 with robotic lysis of adhesionson 4/25 -- started on Zosyn on admission,blood  culture negative -- 5/4--CT abdomen repeated today, report per surgery look stable possible cellulitis and added IV vancomycin --5/5-- change to PO Bactrim. White count trending down. Patient asymptomatic. Okay from surgery standpoint to discharge and follow-up as outpatient. Patient is having small BMs. --sepsis resolved prior to d/c  Mild elevation of LFT,likely postop findings Hyperbilirubinemia due to Biliary leak s/p ERCP with stent placement --Resume Lipitor --HIDA scan positive for  biliary leak --5/3-- patient underwent ERCP with biliary stent placement by Dr. Allen Norris --5/4--lipase normal, bilirubin trending down --5/5-- tolerating PO diet. LFTs remain stable.  CKD 2 -Baseline creatinine 1.1,creatinine today is 1.3   Chronic left shoulder pain -Pain control  CAD (coronary artery disease) -No complaints of chest pain.  --cont  aspirin and Lipitor   Essential hypertension -Hydralazine IV as needed -- resume lisinopril   Procedures: Adhesion lysis for SBO on 4/25, ERCP with stent placement 5/3 Family communication :brother in the room 5/3 Consults :Gen surgery CODE STATUS: full DVT Prophylaxis :lovenox Level of care: Med-Surg Status is: Inpatient  Dispo: The patient is from: Home  Anticipated d/c is to: Home  Patient currently is  medically stable to d/c.              Difficult to place patient No okay from surgery standpoint for patient to discharge. CONSULTS OBTAINED:  Treatment Team:  Jules Husbands, MD  DRUG ALLERGIES:   Allergies  Allergen Reactions  . Codeine Itching    Other reaction(s): psychological reaction, Unable to Obtain  . Hydrocodone Itching and Rash  . Oxycodone Itching and Rash  . Diphenoxylate-Atropine Rash    Diffuse with persistent itching  DISCHARGE MEDICATIONS:   Allergies as of 01/31/2021      Reactions   Codeine Itching   Other reaction(s): psychological reaction, Unable to Obtain    Hydrocodone Itching, Rash   Oxycodone Itching, Rash   Diphenoxylate-atropine Rash   Diffuse with persistent itching      Medication List    TAKE these medications   aspirin 81 MG EC tablet Take 81 mg by mouth daily.   atorvastatin 80 MG tablet Commonly known as: LIPITOR Take 80 mg by mouth at bedtime.   lisinopril 20 MG tablet Commonly known as: ZESTRIL Take 20 mg by mouth daily.   nitroGLYCERIN 0.4 MG SL tablet Commonly known as: NITROSTAT Place 0.4 mg under the tongue every 5 (five) minutes as needed for chest pain.   sulfamethoxazole-trimethoprim 800-160 MG tablet Commonly known as: Bactrim DS Take 1 tablet by mouth 2 (two) times daily for 10 days.   traMADol-acetaminophen 37.5-325 MG tablet Commonly known as: Ultracet Take 1-2 tablets by mouth every 4 (four) hours as needed.            Discharge Care Instructions  (From admission, onward)         Start     Ordered   01/31/21 0000  Discharge wound care:       Comments: None per surgery   01/31/21 0912          If you experience worsening of your admission symptoms, develop shortness of breath, life threatening emergency, suicidal or homicidal thoughts you must seek medical attention immediately by calling 911 or calling your MD immediately  if symptoms less severe.  You Must read complete instructions/literature along with all the possible adverse reactions/side effects for all the Medicines you take and that have been prescribed to you. Take any new Medicines after you have completely understood and accept all the possible adverse reactions/side effects.   Please note  You were cared for by a hospitalist during your hospital stay. If you have any questions about your discharge medications or the care you received while you were in the hospital after you are discharged, you can call the unit and asked to speak with the hospitalist on call if the hospitalist that took care of you is not available. Once you  are discharged, your primary care physician will handle any further medical issues. Please note that NO REFILLS for any discharge medications will be authorized once you are discharged, as it is imperative that you return to your primary care physician (or establish a relationship with a primary care physician if you do not have one) for your aftercare needs so that they can reassess your need for medications and monitor your lab values. Today   SUBJECTIVE   Overall doing well no abdominal pain no fever. Tolerating PO soft diet.  VITAL SIGNS:  Blood pressure (!) 141/88, pulse 95, temperature 98.5 F (36.9 C), resp. rate 20, height 6' (1.829 m), weight 112.2 kg, SpO2 96 %.  I/O:    Intake/Output Summary (Last 24 hours) at 01/31/2021 0913 Last data filed at 01/31/2021 0219 Gross per 24 hour  Intake 858.44 ml  Output --  Net 858.44 ml    PHYSICAL EXAMINATION:   GENERAL:  60 y.o.-year-old patient lying in the bed with no acute distress.  LUNGS: Normal breath sounds bilaterally, no wheezing, rales, rhonchi. No use of accessory muscles of respiration.  CARDIOVASCULAR: S1, S2 normal. No murmurs, rubs, or gallops.  ABDOMEN: Soft, nontender, mild distention right upper quadrant. Nontender.  Bowel sounds present. No organomegaly or mass.  EXTREMITIES: No cyanosis, clubbing or edema b/l.    NEUROLOGIC: Cranial nerves II through XII are intact. No focal Motor or sensory deficits b/l.   PSYCHIATRIC:  patient is alert and oriented x 3.  SKIN: No obvious rash, lesion, or ulcer.   DATA REVIEW:   CBC  Recent Labs  Lab 01/31/21 0350  WBC 14.6*  HGB 10.9*  HCT 34.1*  PLT 283    Chemistries  Recent Labs  Lab 01/28/21 0444 01/30/21 0542 01/31/21 0350  NA 140   < > 142  K 3.8   < > 3.9  CL 103   < > 109  CO2 26   < > 26  GLUCOSE 133*   < > 133*  BUN 26*   < > 24*  CREATININE 1.35*   < > 1.25*  CALCIUM 8.7*   < > 8.3*  MG 2.0  --   --   AST 23   < > 39  ALT 38   < > 25  ALKPHOS  34*   < > 64  BILITOT 4.1*   < > 2.4*   < > = values in this interval not displayed.    Microbiology Results   Recent Results (from the past 240 hour(s))  SARS CORONAVIRUS 2 (TAT 6-24 HRS) Nasopharyngeal Nasopharyngeal Swab     Status: None   Collection Time: 01/23/21 10:46 AM   Specimen: Nasopharyngeal Swab  Result Value Ref Range Status   SARS Coronavirus 2 NEGATIVE NEGATIVE Final    Comment: (NOTE) SARS-CoV-2 target nucleic acids are NOT DETECTED.  The SARS-CoV-2 RNA is generally detectable in upper and lower respiratory specimens during the acute phase of infection. Negative results do not preclude SARS-CoV-2 infection, do not rule out co-infections with other pathogens, and should not be used as the sole basis for treatment or other patient management decisions. Negative results must be combined with clinical observations, patient history, and epidemiological information. The expected result is Negative.  Fact Sheet for Patients: SugarRoll.be  Fact Sheet for Healthcare Providers: https://www.woods-mathews.com/  This test is not yet approved or cleared by the [place] FDA and  has been authorized for detection and/or diagnosis of SARS-CoV-2 by FDA under an Emergency Use Authorization (EUA). This EUA will remain  in effect (meaning this test can be used) for the duration of the COVID-19 declaration under Se ction 564(b)(1) of the Act, 21 U.S.C. section 360bbb-3(b)(1), unless the authorization is terminated or revoked sooner.  Performed at [facility], [place] [address] [place], [place] [address]   Culture, blood (routine x 2)     Status: None   Collection Time: 01/26/21  8:28 PM   Specimen: BLOOD  Result Value Ref Range Status   Specimen Description BLOOD RIGHT ANTECUBITAL  Final   Special Requests   Final    BOTTLES DRAWN AEROBIC AND ANAEROBIC Blood Culture results may not be optimal due to an inadequate volume of  blood received in culture bottles   Culture   Final    NO GROWTH 5 DAYS Performed at [facility], [address] [place], [place] [address]    Report Status 01/31/2021 FINAL  Final  Culture, blood (routine x 2)     Status: None   Collection Time: 01/26/21  8:28 PM   Specimen: BLOOD  Result Value Ref Range Status   Specimen Description BLOOD BLOOD RIGHT FOREARM  Final   Special Requests   Final  BOTTLES DRAWN AEROBIC AND ANAEROBIC Blood Culture adequate volume   Culture   Final    NO GROWTH 5 DAYS Performed at [facility], [address] [place], [place] [address]    Report Status 01/31/2021 FINAL  Final  Resp Panel by RT-PCR (Flu A&B, Covid) Nasopharyngeal Swab     Status: None   Collection Time: 01/26/21 10:09 PM   Specimen: Nasopharyngeal Swab; Nasopharyngeal(NP) swabs in vial transport medium  Result Value Ref Range Status   SARS Coronavirus 2 by RT PCR NEGATIVE NEGATIVE Final    Comment: (NOTE) SARS-CoV-2 target nucleic acids are NOT DETECTED.  The SARS-CoV-2 RNA is generally detectable in upper respiratory specimens during the acute phase of infection. The lowest concentration of SARS-CoV-2 viral copies this assay can detect is 138 copies/mL. A negative result does not preclude SARS-Cov-2 infection and should not be used as the sole basis for treatment or other patient management decisions. A negative result may occur with  improper specimen collection/handling, submission of specimen other than nasopharyngeal swab, presence of viral mutation(s) within the areas targeted by this assay, and inadequate number of viral copies(<138 copies/mL). A negative result must be combined with clinical observations, patient history, and epidemiological information. The expected result is Negative.  Fact Sheet for Patients:  EntrepreneurPulse.com.au  Fact Sheet for Healthcare Providers:  IncredibleEmployment.be  This test  is no t yet approved or cleared by the [place] FDA and  has been authorized for detection and/or diagnosis of SARS-CoV-2 by FDA under an Emergency Use Authorization (EUA). This EUA will remain  in effect (meaning this test can be used) for the duration of the COVID-19 declaration under Section 564(b)(1) of the Act, 21 U.S.C.section 360bbb-3(b)(1), unless the authorization is terminated  or revoked sooner.       Influenza A by PCR NEGATIVE NEGATIVE Final   Influenza B by PCR NEGATIVE NEGATIVE Final    Comment: (NOTE) The Xpert Xpress SARS-CoV-2/FLU/RSV plus assay is intended as an aid in the diagnosis of influenza from Nasopharyngeal swab specimens and should not be used as a sole basis for treatment. Nasal washings and aspirates are unacceptable for Xpert Xpress SARS-CoV-2/FLU/RSV testing.  Fact Sheet for Patients: EntrepreneurPulse.com.au  Fact Sheet for Healthcare Providers: IncredibleEmployment.be  This test is not yet approved or cleared by the [place] FDA and has been authorized for detection and/or diagnosis of SARS-CoV-2 by FDA under an Emergency Use Authorization (EUA). This EUA will remain in effect (meaning this test can be used) for the duration of the COVID-19 declaration under Section 564(b)(1) of the Act, 21 U.S.C. section 360bbb-3(b)(1), unless the authorization is terminated or revoked.  Performed at [facility], [address] [place], [place] [address]     RADIOLOGY:  CT ABDOMEN PELVIS W CONTRAST  Result Date: 01/30/2021 CLINICAL DATA:  Cholecystectomy, bile leak, leukocytosis and new onset edema in skin changed right lower quadrant incision site in induration of the abdominal wall EXAM: CT ABDOMEN AND PELVIS WITH CONTRAST TECHNIQUE: Multidetector CT imaging of the abdomen and pelvis was performed using the standard protocol following bolus administration of intravenous contrast. CONTRAST:  139mL  OMNIPAQUE IOHEXOL 300 MG/ML SOLN, additional oral enteric contrast COMPARISON:  CT abdomen pelvis, 01/26/2021, MR abdomen, 01/27/2021 FINDINGS: Lower chest: No acute abnormality. Hepatobiliary: No solid liver abnormality is seen. Status post cholecystectomy. There is redemonstrated, heterogeneous fluid in the gallbladder fossa and hepatorenal recess, similar to prior examination (series 2, image 29). Interval placement of a common bile duct stent,  tip positioned in the duodenum. Pancreas: Unremarkable. No pancreatic ductal dilatation or surrounding inflammatory changes. Spleen: Normal in size without significant abnormality. Adrenals/Urinary Tract: Adrenal glands are unremarkable. A small calculus measuring 4 mm has migrated to the right renal pelvis near the ureteropelvic junction (series 2, image 47). No right-sided hydronephrosis. Bladder is unremarkable. Stomach/Bowel: Stomach is within normal limits. Appendix is surgically absent. No evidence of bowel wall thickening, distention, or inflammatory changes. Sigmoid diverticulosis. Vascular/Lymphatic: No significant vascular findings are present. No enlarged abdominal or pelvic lymph nodes. Reproductive: Urolift implants. Other: No abdominal wall hernia. There is soft tissue edema of the right lateral abdominal wall and flank, significantly increased compared to prior examination (series 2, image 38). There is mild, diffuse anasarca. Small volume perihepatic, perisplenic, and pelvic ascites, similar in volume to prior examination. Musculoskeletal: No acute or significant osseous findings. IMPRESSION: 1. There is soft tissue edema of the right lateral abdominal wall and flank, significantly increased compared to prior examination. Findings are concerning for soft tissue infection in the postoperative setting. 2. There is redemonstrated, heterogeneous fluid in the gallbladder fossa and hepatorenal recess, similar to prior examination, in keeping with known biloma. 3.  Interval placement of a common bile duct stent, tip positioned in the duodenum. 4. A small calculus measuring 4 mm has migrated to the right renal pelvis near the ureteropelvic junction. No right-sided hydronephrosis. 5. Small volume perihepatic, perisplenic, and pelvic ascites, similar in volume to prior examination. 6. No discrete fluid collection suggestive of abscess identified in the abdomen or pelvis. Electronically Signed   By: Eddie Candle M.D.   On: 01/30/2021 14:53   DG C-Arm 1-60 Min-No Report  Result Date: 01/29/2021 Fluoroscopy was utilized by the requesting physician.  No radiographic interpretation.     CODE STATUS:     Code Status Orders  (From admission, onward)         Start     Ordered   01/26/21 2315  Full code  Continuous        01/26/21 2317        Code Status History    This patient has a current code status but no historical code status.   Advance Care Planning Activity       TOTAL TIME TAKING CARE OF THIS PATIENT: *40* minutes.    Fritzi Mandes M.D  [facility]    CC: Primary care physician; Stanford Breed, PA-C

## 2021-02-06 ENCOUNTER — Ambulatory Visit (INDEPENDENT_AMBULATORY_CARE_PROVIDER_SITE_OTHER): Payer: BLUE CROSS/BLUE SHIELD | Admitting: Surgery

## 2021-02-06 ENCOUNTER — Encounter: Payer: Self-pay | Admitting: Surgery

## 2021-02-06 ENCOUNTER — Other Ambulatory Visit: Payer: Self-pay

## 2021-02-06 VITALS — BP 130/81 | HR 76 | Temp 98.3°F | Ht 72.0 in | Wt 238.4 lb

## 2021-02-06 DIAGNOSIS — Z09 Encounter for follow-up examination after completed treatment for conditions other than malignant neoplasm: Secondary | ICD-10-CM

## 2021-02-06 NOTE — Progress Notes (Signed)
This is a 60 year old male status post robotic cholecystectomy complicated by bile leak fromn duct of luschka that required ERCP Now feeling much better Taking PO No fevers or chills Ambulating  PE: NAD Abd: soft, nt, incisions c/d/i, abd wall induration has completely resolved  A/ p Doing well after ERCP F/u 3-4 weeks No need for further interventions

## 2021-02-06 NOTE — Patient Instructions (Addendum)
Follow up here in 4 weeks. Dr Dorothey Baseman office should contact you for a follow up.  You may increase activity as tolerated.    GENERAL POST-OPERATIVE PATIENT INSTRUCTIONS   WOUND CARE INSTRUCTIONS:  Keep a dry clean dressing on the wound if there is drainage. The initial bandage may be removed after 24 hours.  Once the wound has quit draining you may leave it open to air.  If clothing rubs against the wound or causes irritation and the wound is not draining you may cover it with a dry dressing during the daytime.  Try to keep the wound dry and avoid ointments on the wound unless directed to do so.  If the wound becomes bright red and painful or starts to drain infected material that is not clear, please contact your physician immediately.  If the wound is mildly pink and has a thick firm ridge underneath it, this is normal, and is referred to as a healing ridge.  This will resolve over the next 4-6 weeks.  BATHING: You may shower if you have been informed of this by your surgeon. However, Please do not submerge in a tub, hot tub, or pool until incisions are completely sealed or have been told by your surgeon that you may do so.  DIET:  You may eat any foods that you can tolerate.  It is a good idea to eat a high fiber diet and take in plenty of fluids to prevent constipation.  If you do become constipated you may want to take a mild laxative or take ducolax tablets on a daily basis until your bowel habits are regular.  Constipation can be very uncomfortable, along with straining, after recent surgery.  ACTIVITY:  You are encouraged to cough and deep breath or use your incentive spirometer if you were given one, every 15-30 minutes when awake.  This will help prevent respiratory complications and low grade fevers post-operatively if you had a general anesthetic.  You may want to hug a pillow when coughing and sneezing to add additional support to the surgical area, if you had abdominal or chest surgery,  which will decrease pain during these times.  You are encouraged to walk and engage in light activity for the next two weeks.  You should not lift more than 20 pounds, until 03/07/2021 as it could put you at increased risk for complications.  Twenty pounds is roughly equivalent to a plastic bag of groceries. At that time- Listen to your body when lifting, if you have pain when lifting, stop and then try again in a few days. Soreness after doing exercises or activities of daily living is normal as you get back in to your normal routine.  MEDICATIONS:  Try to take narcotic medications and anti-inflammatory medications, such as tylenol, ibuprofen, naprosyn, etc., with food.  This will minimize stomach upset from the medication.  Should you develop nausea and vomiting from the pain medication, or develop a rash, please discontinue the medication and contact your physician.  You should not drive, make important decisions, or operate machinery when taking narcotic pain medication.  SUNBLOCK Use sun block to incision area over the next year if this area will be exposed to sun. This helps decrease scarring and will allow you avoid a permanent darkened area over your incision.  QUESTIONS:  Please feel free to call our office if you have any questions, and we will be glad to assist you.

## 2021-02-21 ENCOUNTER — Telehealth: Payer: Self-pay

## 2021-02-21 NOTE — Telephone Encounter (Signed)
Patient called and was asking if we had received his lab results. When I asked the patient about this he said that [facility] was faxing over his lab results from Tuesday. I let him know that we had not received anything from them. He then got upset and said that when he was at [facility] they could see all of his records and wanted to know why we could not do that. I let him know that our providers could see some information from other health systems but not everything. He then got more upset and asked why I told him we did not have his lab results. I again let him know that nothing had been faxed to [place]. I let him know that I could send a message to Dr Dahlia Byes to have him look at his lab work that he had done at [place] as it is showing up in our system. He seemed to accept this. When I reviewed his follow up appointment appointment that was scheduled with Dr Dahlia Byes he did not know if he would be coming back. He said that he was not doing well but refused to elaborate. He then hung up.

## 2021-03-06 ENCOUNTER — Encounter: Payer: BLUE CROSS/BLUE SHIELD | Admitting: Surgery

## 2021-03-23 IMAGING — US US ABDOMEN LIMITED
1 series · 14 of 25 positions shown · non-contrast
Comparison: 03/30/2019

CLINICAL DATA: Right upper quadrant pain

EXAM:
ULTRASOUND ABDOMEN LIMITED RIGHT UPPER QUADRANT

[Series 1: us abdomen limited ruq (liver/gb) · 14 of 43 slices shown]
[im 1/43]
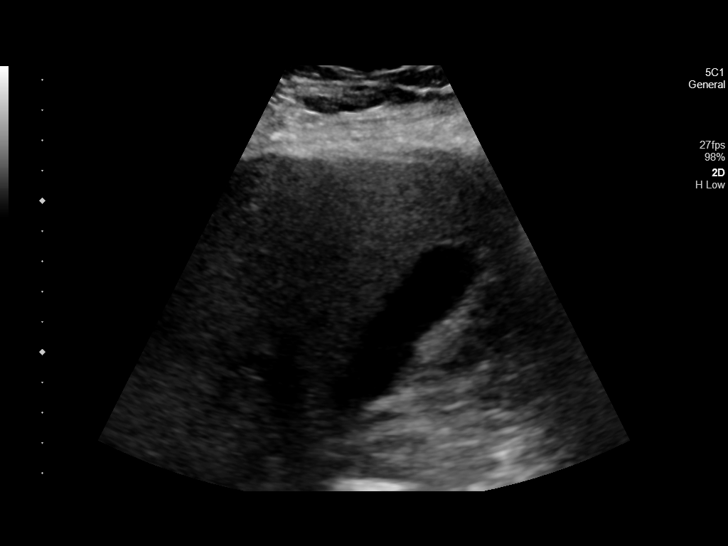
[im 4/43]
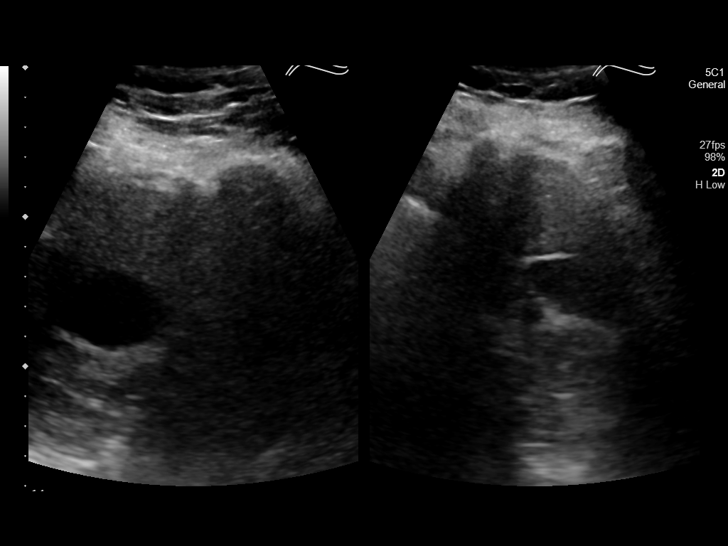
[im 8/43]
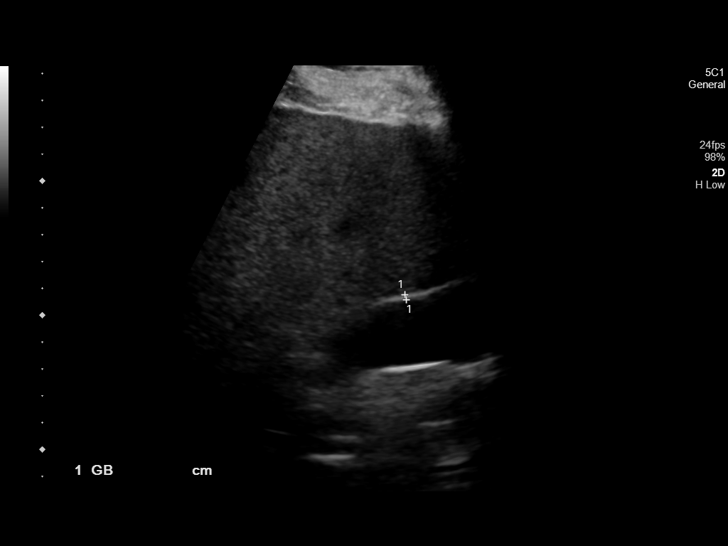
[im 11/43]
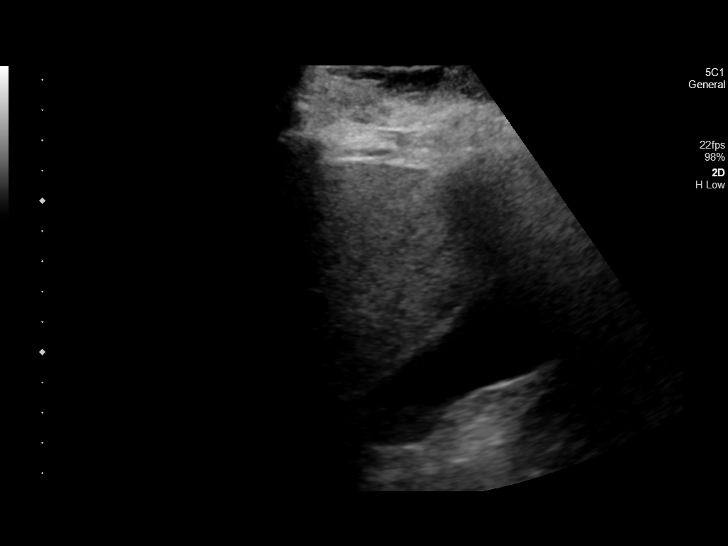
[im 15/43]
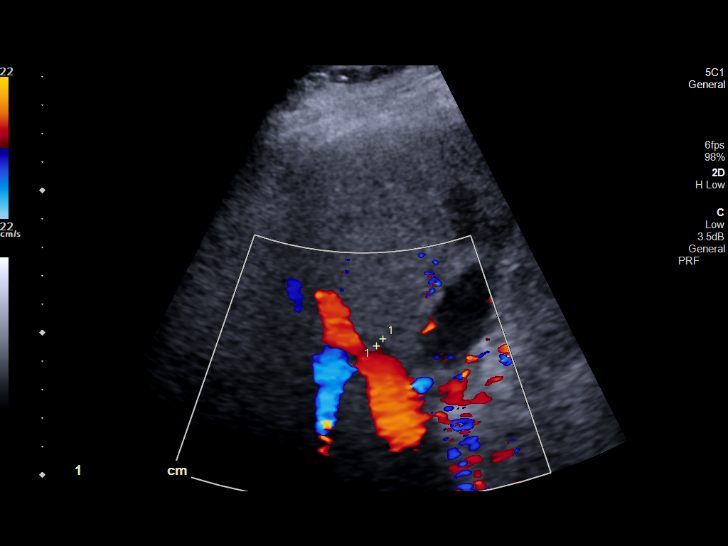
[im 16/43]
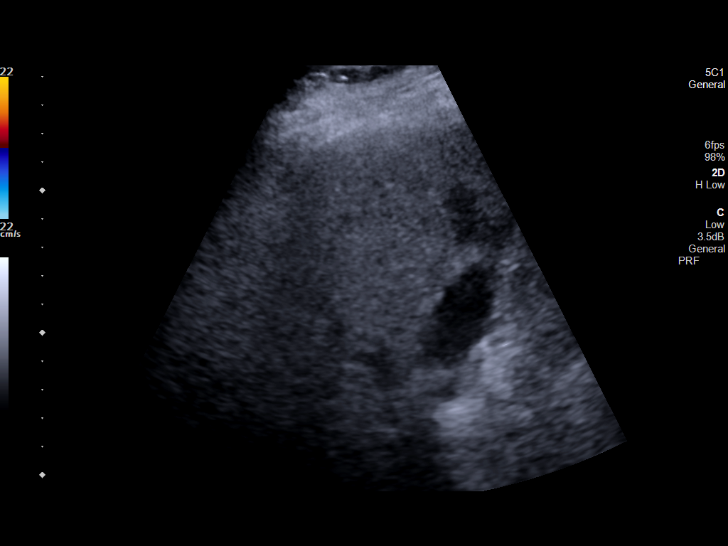
[im 20/43]
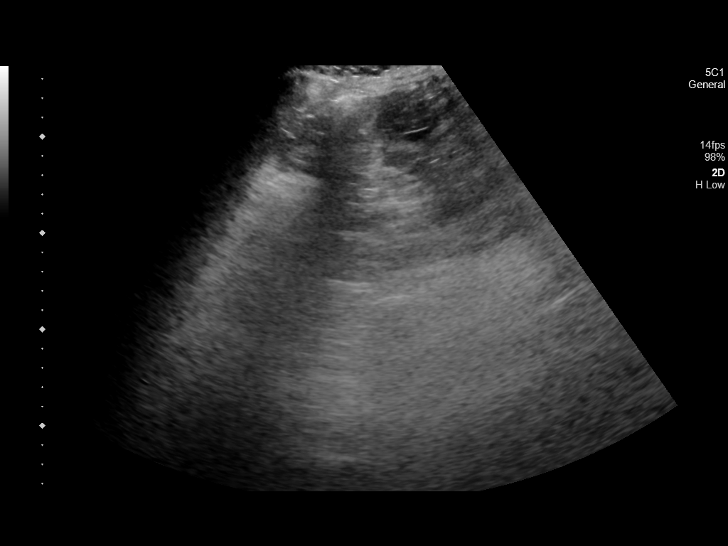
[im 23/43]
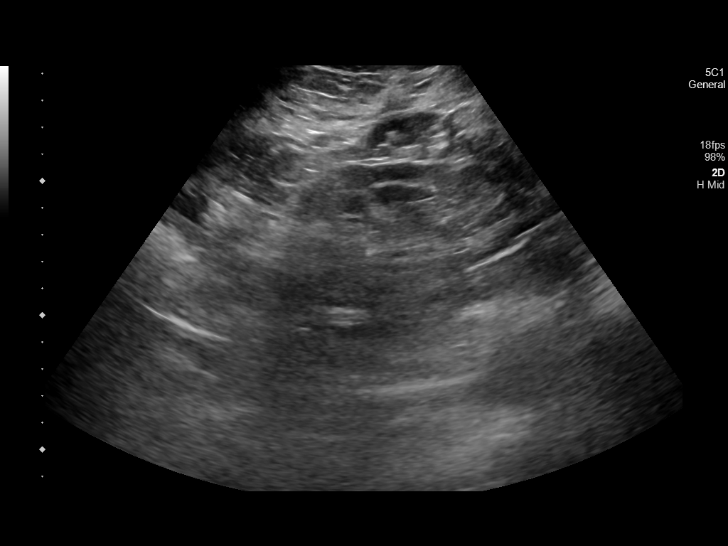
[im 27/43]
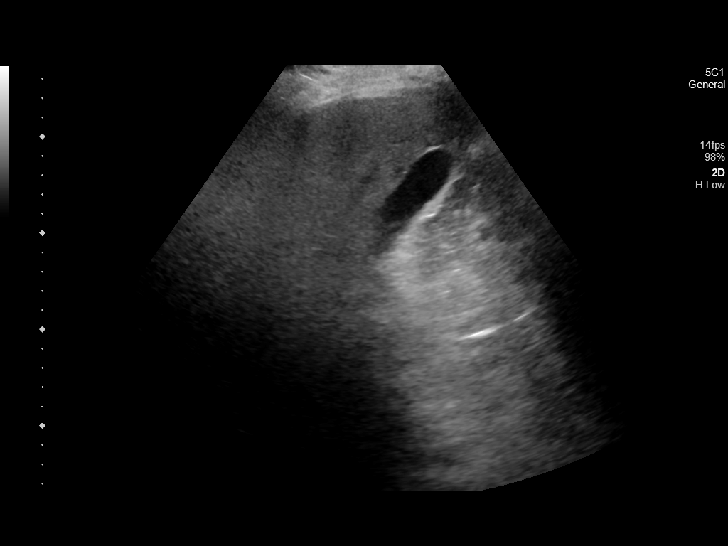
[im 29/43]
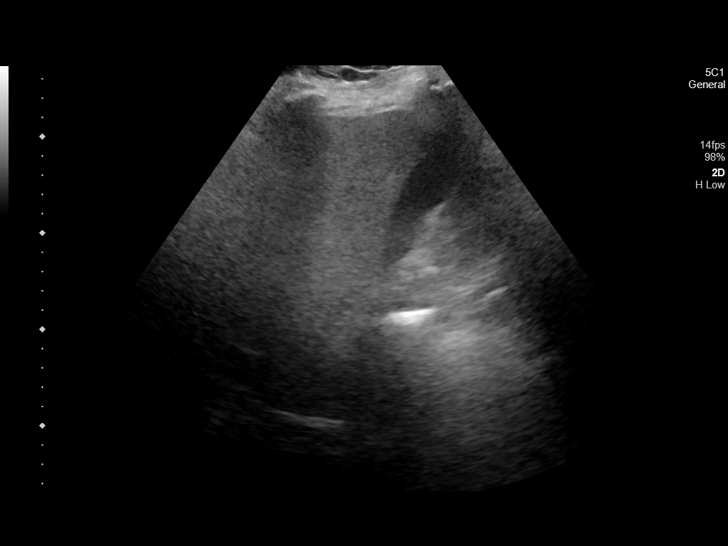
[im 32/43]
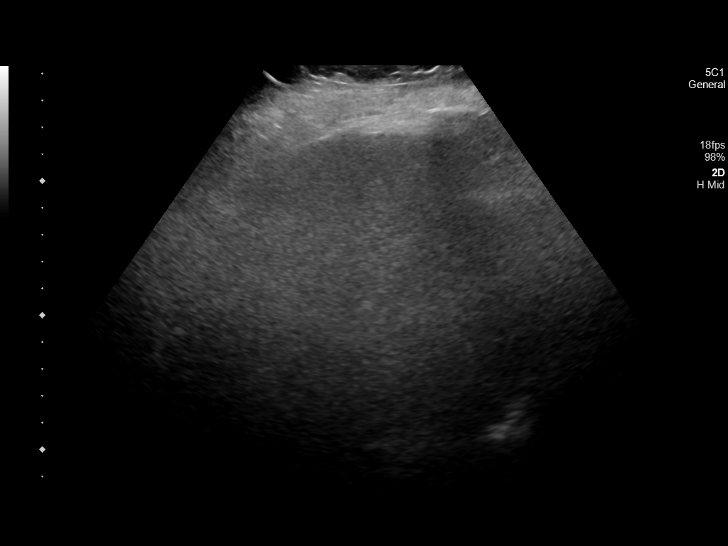
[im 36/43]
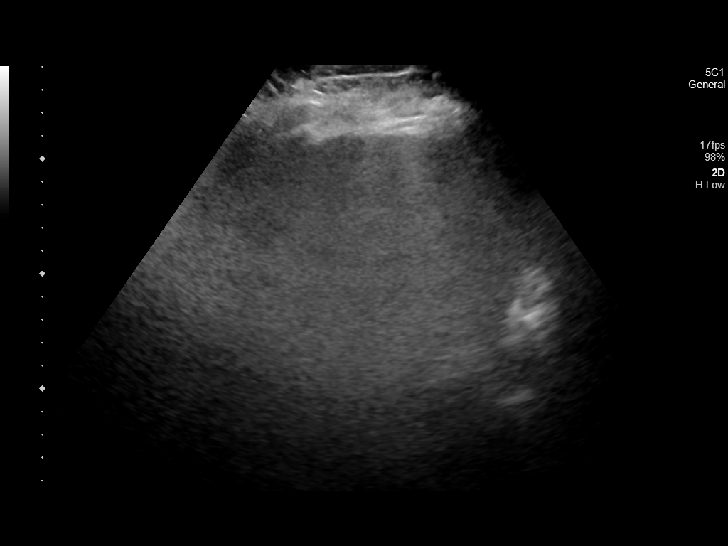
[im 39/43]
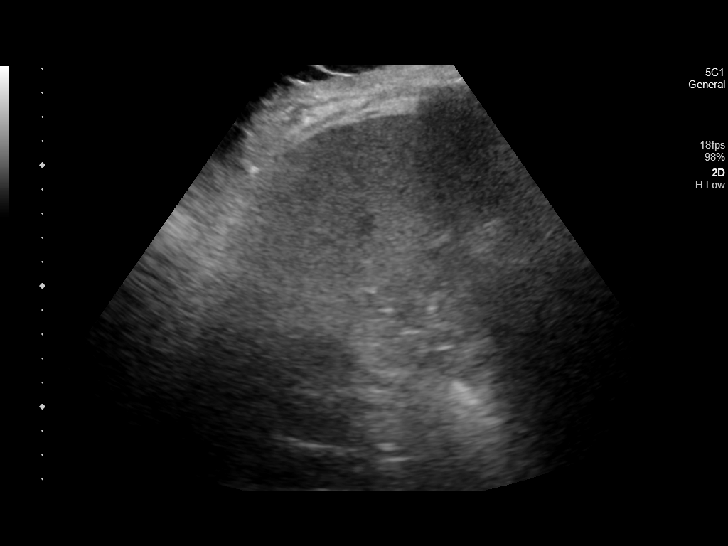
[im 43/43]
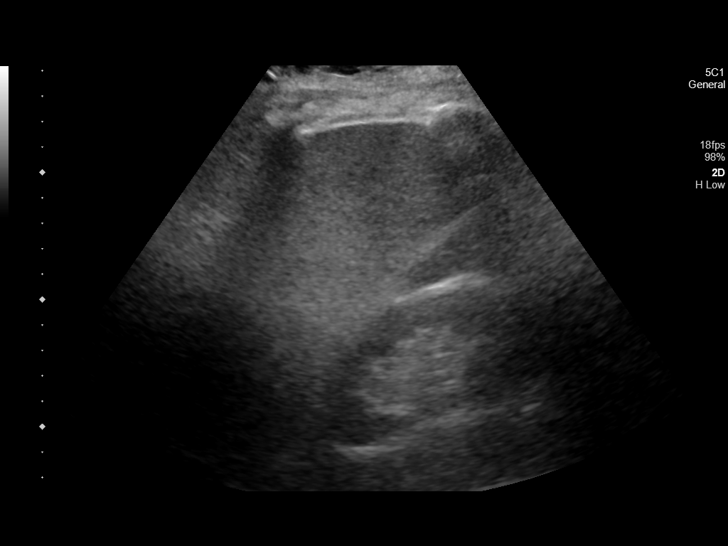

[14 of 25 positions shown; findings below may reference images not displayed]

FINDINGS: Gallbladder:

No gallstones or wall thickening visualized. No sonographic Murphy
sign noted by sonographer.

Common bile duct:

Diameter: 4 mm

Liver:

Heterogeneous increased echogenicity compatible with Hepatic
steatosis. Hypoechoic area along the gallbladder fossa favored to be
fatty sparing. No biliary dilatation. No large focal hepatic
abnormality. Portal vein is patent on color Doppler imaging with
normal direction of blood flow towards the liver.

Other: No free fluid or ascites. Included views of the right kidney
demonstrate no hydronephrosis.
IMPRESSION: Negative for gallstones or biliary dilatation.

Hepatic steatosis as above.

No free fluid or acute finding by ultrasound.

## 2021-04-04 ENCOUNTER — Other Ambulatory Visit: Payer: Self-pay

## 2021-04-04 DIAGNOSIS — Z4582 Encounter for adjustment or removal of myringotomy device (stent) (tube): Secondary | ICD-10-CM

## 2021-04-10 ENCOUNTER — Ambulatory Visit: Payer: BLUE CROSS/BLUE SHIELD | Admitting: Gastroenterology

## 2021-05-09 ENCOUNTER — Other Ambulatory Visit: Payer: Self-pay

## 2021-05-23 ENCOUNTER — Encounter
Admission: RE | Disposition: A | Discharge status: Home / Self Care | Payer: Self-pay | Source: Home / Self Care | Attending: Gastroenterology

## 2021-05-23 ENCOUNTER — Ambulatory Visit: Payer: BLUE CROSS/BLUE SHIELD | Admitting: Certified Registered"

## 2021-05-23 ENCOUNTER — Encounter: Payer: Self-pay | Admitting: Gastroenterology

## 2021-05-23 ENCOUNTER — Ambulatory Visit
Admission: RE | Admit: 2021-05-23 | Discharge: 2021-05-23 | Disposition: A | Discharge status: Home / Self Care | Payer: BLUE CROSS/BLUE SHIELD | Attending: Gastroenterology | Admitting: Gastroenterology

## 2021-05-23 ENCOUNTER — Ambulatory Visit: Payer: BLUE CROSS/BLUE SHIELD

## 2021-05-23 DIAGNOSIS — Z888 Allergy status to other drugs, medicaments and biological substances status: Secondary | ICD-10-CM | POA: Diagnosis not present

## 2021-05-23 DIAGNOSIS — Z4582 Encounter for adjustment or removal of myringotomy device (stent) (tube): Secondary | ICD-10-CM

## 2021-05-23 DIAGNOSIS — Z4589 Encounter for adjustment and management of other implanted devices: Secondary | ICD-10-CM | POA: Insufficient documentation

## 2021-05-23 DIAGNOSIS — Z885 Allergy status to narcotic agent status: Secondary | ICD-10-CM | POA: Insufficient documentation

## 2021-05-23 DIAGNOSIS — Z79899 Other long term (current) drug therapy: Secondary | ICD-10-CM | POA: Insufficient documentation

## 2021-05-23 DIAGNOSIS — Z7982 Long term (current) use of aspirin: Secondary | ICD-10-CM | POA: Insufficient documentation

## 2021-05-23 DIAGNOSIS — N189 Chronic kidney disease, unspecified: Secondary | ICD-10-CM | POA: Insufficient documentation

## 2021-05-23 DIAGNOSIS — I129 Hypertensive chronic kidney disease with stage 1 through stage 4 chronic kidney disease, or unspecified chronic kidney disease: Secondary | ICD-10-CM | POA: Diagnosis not present

## 2021-05-23 HISTORY — PX: ERCP: SHX5425

## 2021-05-23 SURGERY — ERCP, WITH INTERVENTION IF INDICATED
Anesthesia: General

## 2021-05-23 MED ORDER — DEXMEDETOMIDINE (PRECEDEX) IN NS 20 MCG/5ML (4 MCG/ML) IV SYRINGE
PREFILLED_SYRINGE | INTRAVENOUS | Status: DC | PRN
  Administered 2021-05-23: 8 ug via INTRAVENOUS

## 2021-05-23 MED ORDER — DEXMEDETOMIDINE (PRECEDEX) IN NS 20 MCG/5ML (4 MCG/ML) IV SYRINGE
PREFILLED_SYRINGE | INTRAVENOUS | Status: DC | PRN
  Administered 2021-05-23: 8 ug via INTRAVENOUS
  Administered 2021-05-23: 4 ug via INTRAVENOUS
  Administered 2021-05-23: 8 ug via INTRAVENOUS

## 2021-05-23 MED ORDER — LIDOCAINE HCL (CARDIAC) PF 100 MG/5ML IV SOSY
PREFILLED_SYRINGE | INTRAVENOUS | Status: DC | PRN
  Administered 2021-05-23: 50 mg via INTRAVENOUS

## 2021-05-23 MED ORDER — SODIUM CHLORIDE 0.9 % IV SOLN
INTRAVENOUS | Status: DC
  Administered 2021-05-23: 1000 mL via INTRAVENOUS

## 2021-05-23 MED ORDER — PROPOFOL 10 MG/ML IV BOLUS
INTRAVENOUS | Status: DC | PRN
  Administered 2021-05-23: 50 mg via INTRAVENOUS

## 2021-05-23 MED ORDER — PROPOFOL 500 MG/50ML IV EMUL
INTRAVENOUS | Status: DC | PRN
  Administered 2021-05-23: 100 ug/kg/min via INTRAVENOUS

## 2021-05-23 NOTE — Anesthesia Preprocedure Evaluation (Signed)
Anesthesia Evaluation  Patient identified by MRN, date of birth, ID band Patient awake    Reviewed: Allergy & Precautions, NPO status , Patient's Chart, lab work & pertinent test results  History of Anesthesia Complications Negative for: history of anesthetic complications  Airway Mallampati: II       Dental  (+) Dental Advidsory Given   Pulmonary shortness of breath and with exertion, asthma (exercise induced, no inhalers) , sleep apnea (not using CPAP) , neg recent URI, Not current smoker,  R hemidiaphragm paralysis          Cardiovascular hypertension, Pt. on medications (-) angina+ CAD  (-) Past MI and (-) CHF (-) dysrhythmias (-) Valvular Problems/Murmurs     Neuro/Psych neg Seizures Anxiety  Neuromuscular disease    GI/Hepatic Neg liver ROS, neg GERD  ,  Endo/Other  neg diabetes  Renal/GU Renal InsufficiencyRenal disease     Musculoskeletal   Abdominal   Peds  Hematology   Anesthesia Other Findings Past Medical History: No date: Asthma No date: BPH (benign prostatic hyperplasia) No date: Chronic kidney disease No date: Dizziness No date: History of kidney stones No date: Hyperlipidemia No date: Hypertension No date: NAION (non-arteritic anterior ischemic optic neuropathy),  right No date: Pneumonia No date: Shortness of breath No date: Sleep apnea No date: Tinnitus No date: Vascular spasm (HCC) No date: Weight loss  Reproductive/Obstetrics                             Anesthesia Physical  Anesthesia Plan  ASA: 3  Anesthesia Plan: General   Post-op Pain Management:    Induction: Intravenous  PONV Risk Score and Plan: 2 and TIVA and Propofol infusion  Airway Management Planned: Natural Airway and Nasal Cannula  Additional Equipment:   Intra-op Plan:   Post-operative Plan:   Informed Consent: I have reviewed the patients History and Physical, chart, labs and  discussed the procedure including the risks, benefits and alternatives for the proposed anesthesia with the patient or authorized representative who has indicated his/her understanding and acceptance.       Plan Discussed with:   Anesthesia Plan Comments:         Anesthesia Quick Evaluation

## 2021-05-23 NOTE — Anesthesia Postprocedure Evaluation (Signed)
Anesthesia Post Note  Patient: Jack Shaw  Procedure(s) Performed: ENDOSCOPIC RETROGRADE CHOLANGIOPANCREATOGRAPHY (ERCP)  Patient location during evaluation: Endoscopy Anesthesia Type: General Level of consciousness: awake and alert Pain management: pain level controlled Vital Signs Assessment: post-procedure vital signs reviewed and stable Respiratory status: spontaneous breathing, nonlabored ventilation, respiratory function stable and patient connected to nasal cannula oxygen Cardiovascular status: blood pressure returned to baseline and stable Postop Assessment: no apparent nausea or vomiting Anesthetic complications: no   No notable events documented.   Last Vitals:  Vitals:   05/23/21 1244 05/23/21 1249  BP: 127/83 129/82  Pulse: 77 72  Resp: 20 16  Temp:    SpO2: 98% 100%    Last Pain:  Vitals:   05/23/21 1249  TempSrc:   PainSc: 0-No pain                 Martha Clan

## 2021-05-23 NOTE — Op Note (Addendum)
[facility] Gastroenterology Patient Name: Jack Shaw Procedure Date: 05/23/2021 12:00 PM MRN: [id number] Account #: [id number] Date of Birth: [birth year] Admit Type: Outpatient Age: 60 Room: [facility] ENDO ROOM 4 Gender: Male Note Status: Finalized Procedure:             ERCP Indications:           Biliary stent removal after being placed for a bile                         leak Providers:             Lucilla Lame MD, MD Referring MD:          No Local Md, MD (Referring MD) Medicines:             Propofol per Anesthesia Complications:         No immediate complications. Procedure:             Pre-Anesthesia Assessment:                        - Prior to the procedure, a History and Physical was                         performed, and patient medications and allergies were                         reviewed. The patient's tolerance of previous                         anesthesia was also reviewed. The risks and benefits                         of the procedure and the sedation options and risks                         were discussed with the patient. All questions were                         answered, and informed consent was obtained. Prior                         Anticoagulants: The patient has taken no previous                         anticoagulant or antiplatelet agents. ASA Grade                         Assessment: II - A patient with mild systemic disease.                         After reviewing the risks and benefits, the patient                         was deemed in satisfactory condition to undergo the                         procedure.  After obtaining informed consent, the scope was passed                         under direct vision. Throughout the procedure, the                         patient's blood pressure, pulse, and oxygen                         saturations were monitored continuously. The [employer] D single use duodenoscope was                         introduced through the mouth, and used to inject                         contrast into and used to inject contrast into the                         bile duct. The ERCP was accomplished without                         difficulty. The patient tolerated the procedure well. Findings:      A biliary stent was visible on the scout film. One stent originating in       the biliary tree was emerging from the major papilla. One stent was       removed from the biliary tree using a snare. A wire was passed into the       biliary tree. The bile duct was deeply cannulated with the short-nosed       traction sphincterotome. Contrast was injected. I personally interpreted       the bile duct images. There was brisk flow of contrast through the       ducts. Image quality was excellent. Contrast extended to the entire       biliary tree. The lower third of the main bile duct contained filling       defect(s) thought to be sludge. The biliary tree was swept with a 15 mm       balloon starting at the bifurcation. Sludge was swept from the duct. Impression:            - One stent from the biliary tree was seen in the                         major papilla.                        - The examination was suspicious for sludge.                        - One stent was removed from the biliary tree.                        - The biliary tree was swept and sludge was found. Recommendation:        - Discharge patient to home.                        -  Resume previous diet.                        - Watch for pancreatitis, bleeding, perforation, and                         cholangitis. Procedure Code(s):     --- Professional ---                        [phone], Endoscopic retrograde cholangiopancreatography                         (ERCP); with removal of foreign body(s) or stent(s)                         from biliary/pancreatic duct(s)                         [id number], Endoscopic retrograde cholangiopancreatography                         (ERCP); with removal of calculi/debris from                         biliary/pancreatic duct(s)                        [id number], Endoscopic catheterization of the biliary                         ductal system, radiological supervision and                         interpretation Diagnosis Code(s):     --- Professional ---                        [id number], Encounter for fitting and adjustment of other                         gastrointestinal appliance and device CPT copyright 2019 American [employer] Association. All rights reserved. The codes documented in this report are preliminary and upon coder review may  be revised to meet current compliance requirements. Lucilla Lame MD, MD 05/23/2021 12:35:27 PM This report has been signed electronically. Number of Addenda: 0 Note Initiated On: 05/23/2021 12:00 PM Estimated Blood Loss:  Estimated blood loss: none.      [facility]

## 2021-05-23 NOTE — Transfer of Care (Signed)
Immediate Anesthesia Transfer of Care Note  Patient: Jack Shaw  Procedure(s) Performed: ENDOSCOPIC RETROGRADE CHOLANGIOPANCREATOGRAPHY (ERCP)  Patient Location: PACU  Anesthesia Type:General  Level of Consciousness: drowsy and patient cooperative  Airway & Oxygen Therapy: Patient Spontanous Breathing  Post-op Assessment: Report given to RN and Post -op Vital signs reviewed and stable  Post vital signs: Reviewed and stable  Last Vitals:  Vitals Value Taken Time  BP 112/74 05/23/21 1234  Temp 36.4 C 05/23/21 1234  Pulse 83 05/23/21 1238  Resp 20 05/23/21 1238  SpO2 96 % 05/23/21 1238  Vitals shown include unvalidated device data.  Last Pain:  Vitals:   05/23/21 1234  TempSrc: Temporal  PainSc: Asleep         Complications: No notable events documented.
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
## 2021-05-24 ENCOUNTER — Encounter: Payer: Self-pay | Admitting: Gastroenterology

## 2021-07-25 IMAGING — NM NM HEPATOBILIARY SCAN
2 series · 8 of 8 positions shown · non-contrast
Comparison: MR abdomen, 01/27/2021

CLINICAL DATA: Abdominal pain, status post cholecystectomy
01/24/2021, evaluate for bile leak

EXAM:
NUCLEAR MEDICINE HEPATOBILIARY IMAGING
TECHNIQUE: Sequential images of the abdomen were obtained [DATE] minutes
following intravenous administration of radiopharmaceutical.
RADIOPHARMACEUTICALS:  7.4 mCi Oc-UUm  Choletec IV

[Series 1000: hepatobiliary scan · 9.59mm/px · 6 of 60 frames shown]
[frame 6/60]
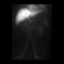
[frame 16/60]
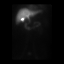
[frame 26/60]
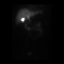
[frame 36/60]
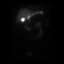
[frame 46/60]
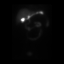
[frame 56/60]
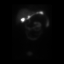

[Series 1000: gallbladder delays · 3.30mm/px · 2 of 2 slices shown]
[im 1/2]
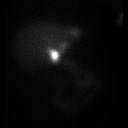
[im 2/2]
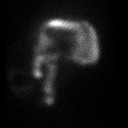

[8 of 8 positions shown; findings below may reference images not displayed]

FINDINGS: Prompt uptake and biliary excretion of activity by the liver is
seen. The common bile duct is patent, with visualization of the
small bowel at 10 minutes. There is accumulating radiotracer
activity in the vicinity of the gallbladder fossa and lesser
peritoneal sac throughout the remainder of the examination.
IMPRESSION: 1. Accumulating radiotracer activity in the vicinity of the
gallbladder fossa and lesser peritoneal sac, consistent with bile
leak in the setting of recent cholecystectomy.

2.  Patent common bile duct.
